# Patient Record
Sex: Female | Born: 1965 | Race: Black or African American | Hispanic: No | Marital: Single | Smoking: Former smoker
Health system: Southern US, Community
[De-identification: ages and names within clinical notes are randomized; demographics above are authoritative.]

## PROBLEM LIST (undated history)

## (undated) DIAGNOSIS — I10 Essential (primary) hypertension: Secondary | ICD-10-CM

## (undated) DIAGNOSIS — E010 Iodine-deficiency related diffuse (endemic) goiter: Secondary | ICD-10-CM

## (undated) DIAGNOSIS — D179 Benign lipomatous neoplasm, unspecified: Secondary | ICD-10-CM

## (undated) DIAGNOSIS — Z8679 Personal history of other diseases of the circulatory system: Secondary | ICD-10-CM

## (undated) DIAGNOSIS — M839 Adult osteomalacia, unspecified: Secondary | ICD-10-CM

## (undated) DIAGNOSIS — M1990 Unspecified osteoarthritis, unspecified site: Secondary | ICD-10-CM

## (undated) DIAGNOSIS — E669 Obesity, unspecified: Secondary | ICD-10-CM

## (undated) HISTORY — DX: Adult osteomalacia, unspecified: M83.9

## (undated) HISTORY — DX: Obesity, unspecified: E66.9

## (undated) HISTORY — DX: Essential (primary) hypertension: I10

## (undated) HISTORY — PX: DILATION AND CURETTAGE OF UTERUS: SHX78

## (undated) HISTORY — DX: Iodine-deficiency related diffuse (endemic) goiter: E01.0

---
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
## 1998-11-28 ENCOUNTER — Encounter: Payer: Self-pay | Admitting: Obstetrics and Gynecology

## 1998-11-28 ENCOUNTER — Inpatient Hospital Stay (HOSPITAL_COMMUNITY): Admission: AD | Admit: 1998-11-28 | Discharge: 1998-12-03 | Payer: Self-pay | Admitting: Obstetrics and Gynecology

## 1998-12-02 ENCOUNTER — Encounter: Payer: Self-pay | Admitting: Obstetrics and Gynecology

## 2000-04-17 ENCOUNTER — Emergency Department (HOSPITAL_COMMUNITY): Admission: EM | Admit: 2000-04-17 | Discharge: 2000-04-17 | Payer: Self-pay | Admitting: Emergency Medicine

## 2000-04-19 ENCOUNTER — Emergency Department (HOSPITAL_COMMUNITY): Admission: EM | Admit: 2000-04-19 | Discharge: 2000-04-19 | Payer: Self-pay | Admitting: Emergency Medicine

## 2000-12-16 ENCOUNTER — Other Ambulatory Visit: Admission: RE | Admit: 2000-12-16 | Discharge: 2000-12-16 | Payer: Self-pay | Admitting: Obstetrics and Gynecology

## 2000-12-21 ENCOUNTER — Ambulatory Visit (HOSPITAL_COMMUNITY): Admission: RE | Admit: 2000-12-21 | Discharge: 2000-12-21 | Payer: Self-pay | Admitting: Obstetrics and Gynecology

## 2000-12-21 ENCOUNTER — Encounter (INDEPENDENT_AMBULATORY_CARE_PROVIDER_SITE_OTHER): Payer: Self-pay

## 2007-08-29 ENCOUNTER — Ambulatory Visit: Payer: Self-pay | Admitting: Family Medicine

## 2007-09-24 ENCOUNTER — Ambulatory Visit: Payer: Self-pay | Admitting: Family Medicine

## 2007-10-07 ENCOUNTER — Ambulatory Visit: Payer: Self-pay | Admitting: Family Medicine

## 2007-10-07 ENCOUNTER — Other Ambulatory Visit: Admission: RE | Admit: 2007-10-07 | Discharge: 2007-10-07 | Payer: Self-pay | Admitting: Family Medicine

## 2007-10-13 LAB — HM PAP SMEAR: HM Pap smear: NEGATIVE

## 2008-11-23 ENCOUNTER — Ambulatory Visit: Payer: Self-pay | Admitting: Family Medicine

## 2009-06-21 ENCOUNTER — Ambulatory Visit: Payer: Self-pay | Admitting: Family Medicine

## 2010-01-11 ENCOUNTER — Ambulatory Visit: Payer: Self-pay | Admitting: Family Medicine

## 2010-06-13 ENCOUNTER — Ambulatory Visit: Payer: Self-pay | Admitting: Family Medicine

## 2010-09-21 LAB — HM MAMMOGRAPHY: HM Mammogram: NEGATIVE

## 2011-03-19 ENCOUNTER — Other Ambulatory Visit: Payer: Self-pay | Admitting: Family Medicine

## 2011-03-23 NOTE — Op Note (Signed)
[facility]  Patient:    Nina Braun, Nina Braun                    MRN: [id number] Proc. Date: 12/21/00 Adm. Date:  [id number] Attending:  Silverio Lay A                           Operative Report  PREOPERATIVE DIAGNOSIS:       Recurrent pregnancy loss with missed abortion.  POSTOPERATIVE DIAGNOSIS:      Recurrent pregnancy loss with missed abortion.  OPERATION:                    D&E.  SURGEON:                      Silverio Lay, M.D.  ANESTHESIA:                   MAC plus paracervical block.  ESTIMATED BLOOD LOSS:         Minimal.  DESCRIPTION OF PROCEDURE:     After reviewing procedure and possible complications with patient including bleeding, infection, uterine perforation, need for laparoscopy, and need for laparotomy, and possibility of retained products of conception needing a second procedure.  Informed consent was obtained.  The patient was taken to OR #3, placed in the lithotomy position, and given IV sedation.  She was prepped and draped in a sterile fashion.  Gyn exam reveals an anteverted uterus with the cervix depth fingertip.  The uterus was compatible with 9-10 weeks pregnancy.  Both adnexa are normal.   A weighted speculum is inserted and the anterior lip of the cervix was grasped with a tenaculum and the proceeded with a paracervical block using _______ 1%, 10 cc in the usual fashion.  The uterus was sounded at 9.5 cm.  Cervical opening allowed a #35 Hegar dilator.  Using a #9 curved cannula, we proceeded with evacuation of uterine contents using suction which does remove products of conception.  After evacuation was completed, a sharp curet was used to remove remainder endometrium until all uterine walls are free of tissue and _________.  Instruments were then removed.  There was no active bleeding.  Estimated blood loss was minimal.  Instruments and sponge count is complete x 2.  The procedure is well-tolerated by patient  who is taken to the recovery room in a well and stable condition. DD:  12/21/00 TD:  12/22/00 Job: [id number]

## 2011-04-17 ENCOUNTER — Other Ambulatory Visit: Payer: Self-pay | Admitting: Family Medicine

## 2011-04-25 ENCOUNTER — Encounter: Payer: Self-pay | Admitting: Family Medicine

## 2011-04-25 DIAGNOSIS — E010 Iodine-deficiency related diffuse (endemic) goiter: Secondary | ICD-10-CM

## 2011-04-25 DIAGNOSIS — E669 Obesity, unspecified: Secondary | ICD-10-CM | POA: Insufficient documentation

## 2011-04-25 DIAGNOSIS — I10 Essential (primary) hypertension: Secondary | ICD-10-CM

## 2011-04-25 DIAGNOSIS — E559 Vitamin D deficiency, unspecified: Secondary | ICD-10-CM | POA: Insufficient documentation

## 2011-05-18 ENCOUNTER — Encounter: Payer: Self-pay | Admitting: Family Medicine

## 2011-05-18 ENCOUNTER — Other Ambulatory Visit: Payer: Self-pay | Admitting: Family Medicine

## 2011-05-22 ENCOUNTER — Ambulatory Visit (INDEPENDENT_AMBULATORY_CARE_PROVIDER_SITE_OTHER): Payer: 59 | Admitting: Family Medicine

## 2011-05-22 ENCOUNTER — Encounter: Payer: Self-pay | Admitting: Family Medicine

## 2011-05-22 VITALS — BP 146/90 | HR 68 | Ht 68.0 in | Wt 229.0 lb

## 2011-05-22 DIAGNOSIS — M839 Adult osteomalacia, unspecified: Secondary | ICD-10-CM

## 2011-05-22 DIAGNOSIS — E669 Obesity, unspecified: Secondary | ICD-10-CM

## 2011-05-22 DIAGNOSIS — I10 Essential (primary) hypertension: Secondary | ICD-10-CM

## 2011-05-22 DIAGNOSIS — Z0000 Encounter for general adult medical examination without abnormal findings: Secondary | ICD-10-CM

## 2011-05-22 LAB — CBC WITH DIFFERENTIAL/PLATELET
Basophils Absolute: 0 10*3/uL (ref 0.0–0.1)
Basophils Relative: 0 % (ref 0–1)
Eosinophils Absolute: 0.1 10*3/uL (ref 0.0–0.7)
Eosinophils Relative: 2 % (ref 0–5)
HCT: 36.2 % (ref 36.0–46.0)
Hemoglobin: 11.9 g/dL — ABNORMAL LOW (ref 12.0–15.0)
Lymphocytes Relative: 45 % (ref 12–46)
Lymphs Abs: 2.3 10*3/uL (ref 0.7–4.0)
MCH: 30.4 pg (ref 26.0–34.0)
MCHC: 32.9 g/dL (ref 30.0–36.0)
MCV: 92.6 fL (ref 78.0–100.0)
Monocytes Absolute: 0.3 10*3/uL (ref 0.1–1.0)
Monocytes Relative: 5 % (ref 3–12)
Neutro Abs: 2.4 10*3/uL (ref 1.7–7.7)
Neutrophils Relative %: 47 % (ref 43–77)
Platelets: 260 10*3/uL (ref 150–400)
RBC: 3.91 MIL/uL (ref 3.87–5.11)
RDW: 13.5 % (ref 11.5–15.5)
WBC: 5.1 10*3/uL (ref 4.0–10.5)

## 2011-05-22 LAB — COMPREHENSIVE METABOLIC PANEL
Albumin: 4.2 g/dL (ref 3.5–5.2)
CO2: 23 mEq/L (ref 19–32)
Calcium: 9.2 mg/dL (ref 8.4–10.5)
Chloride: 101 mEq/L (ref 96–112)
Glucose, Bld: 88 mg/dL (ref 70–99)
Potassium: 4 mEq/L (ref 3.5–5.3)
Sodium: 135 mEq/L (ref 135–145)
Total Protein: 7.2 g/dL (ref 6.0–8.3)

## 2011-05-22 LAB — LIPID PANEL
Cholesterol: 189 mg/dL (ref 0–200)
Triglycerides: 84 mg/dL (ref <150)

## 2011-05-22 NOTE — Progress Notes (Signed)
  Subjective:    Patient ID: Nina Braun, female    DOB: [birth year], 45 y.o.   MRN: [id number]  HPI She is here for a complete examination. She is now involved in Weight Watchers and so far has lost 15 pounds. She does have a goal weight in mind. She has no particular concerns or complaints. Past medical history is significant for hypertension, thyromegaly and vitamin D deficiency. She does continue on a multivitamin.   Review of Systems  Constitutional: Negative.   HENT: Negative.   Eyes: Negative.   Respiratory: Negative.   Cardiovascular: Negative.   Gastrointestinal: Negative.   Genitourinary: Negative.   Musculoskeletal: Negative.   Neurological: Negative.   Hematological: Negative.   Psychiatric/Behavioral: Negative.        Objective:   Physical Exam BP 146/90  Pulse 68  Ht 5\' 8"  (1.727 m)  Wt 229 lb (103.874 kg)  BMI 34.82 kg/m2  LMP 05/14/2011  General Appearance:    Alert, cooperative, no distress, appears stated age  Head:    Normocephalic, without obvious abnormality, atraumatic  Eyes:    PERRL, conjunctiva/corneas clear, EOM's intact, fundi    benign  Ears:    Normal TM's and external ear canals  Nose:   Nares normal, mucosa normal, no drainage or sinus   tenderness  Throat:   Lips, mucosa, and tongue normal; teeth and gums normal  Neck:   Supple, no lymphadenopathy;  thyroid:  no   enlargement/tenderness/nodules; no carotid   bruit or JVD  Back:    Spine nontender, no curvature, ROM normal, no CVA     tenderness  Lungs:     Clear to auscultation bilaterally without wheezes, rales or     ronchi; respirations unlabored  Chest Wall:    No tenderness or deformity   Heart:    Regular rate and rhythm, S1 and S2 normal, no murmur, rub   or gallop  Breast Exam:    Deferred to GYN  Abdomen:     Soft, non-tender, nondistended, normoactive bowel sounds,    no masses, no hepatosplenomegaly  Genitalia:    Deferred to GYN     Extremities:   No clubbing, cyanosis  or edema  Pulses:   2+ and symmetric all extremities  Skin:   Skin color, texture, turgor normal, no rashes or lesions  Lymph nodes:   Cervical, supraclavicular, and axillary nodes normal  Neurologic:   CNII-XII intact, normal strength, sensation and gait; reflexes 2+ and symmetric throughout          Psych:   Normal mood, affect, hygiene and grooming.           Assessment & Plan:  Hypertension. Vitamin D deficiency. Obesity. Encouraged her to continue with the Weight Watchers program. Discussed setting a goal dressed or pants size as opposed to weight. Continue on present medications. Routine blood screening including vitamin D. will be ordered.

## 2011-05-23 ENCOUNTER — Telehealth: Payer: Self-pay

## 2011-05-23 LAB — VITAMIN D 25 HYDROXY (VIT D DEFICIENCY, FRACTURES): Vit D, 25-Hydroxy: 25 ng/mL — ABNORMAL LOW (ref 30–89)

## 2011-05-23 NOTE — Telephone Encounter (Signed)
Pt informed

## 2011-06-17 ENCOUNTER — Other Ambulatory Visit: Payer: Self-pay | Admitting: Family Medicine

## 2011-07-19 ENCOUNTER — Other Ambulatory Visit: Payer: Self-pay | Admitting: Family Medicine

## 2011-09-14 ENCOUNTER — Other Ambulatory Visit: Payer: Self-pay | Admitting: Family Medicine

## 2011-11-17 ENCOUNTER — Other Ambulatory Visit: Payer: Self-pay | Admitting: Family Medicine

## 2012-01-15 ENCOUNTER — Other Ambulatory Visit: Payer: Self-pay | Admitting: Family Medicine

## 2012-02-21 ENCOUNTER — Encounter: Payer: Self-pay | Admitting: Internal Medicine

## 2012-03-14 ENCOUNTER — Other Ambulatory Visit: Payer: Self-pay | Admitting: Family Medicine

## 2012-04-22 ENCOUNTER — Encounter: Payer: Self-pay | Admitting: Family Medicine

## 2012-04-22 ENCOUNTER — Ambulatory Visit (INDEPENDENT_AMBULATORY_CARE_PROVIDER_SITE_OTHER): Payer: 59 | Admitting: Family Medicine

## 2012-04-22 VITALS — BP 142/90 | HR 62 | Wt 211.0 lb

## 2012-04-22 DIAGNOSIS — E049 Nontoxic goiter, unspecified: Secondary | ICD-10-CM

## 2012-04-22 DIAGNOSIS — Z79899 Other long term (current) drug therapy: Secondary | ICD-10-CM

## 2012-04-22 DIAGNOSIS — E669 Obesity, unspecified: Secondary | ICD-10-CM

## 2012-04-22 DIAGNOSIS — I10 Essential (primary) hypertension: Secondary | ICD-10-CM

## 2012-04-22 DIAGNOSIS — E010 Iodine-deficiency related diffuse (endemic) goiter: Secondary | ICD-10-CM

## 2012-04-22 DIAGNOSIS — E559 Vitamin D deficiency, unspecified: Secondary | ICD-10-CM

## 2012-04-22 MED ORDER — LISINOPRIL-HYDROCHLOROTHIAZIDE 20-12.5 MG PO TABS: 1.0000 | ORAL_TABLET | Freq: Every day | ORAL | Status: DC

## 2012-04-22 NOTE — Patient Instructions (Signed)
Keep taking good care of yourself and stick with the Weight Watchers program

## 2012-04-22 NOTE — Progress Notes (Signed)
  Subjective:    Patient ID: Nina Braun, female    DOB: Jun 20, 1966, 46 y.o.   MRN: [id number]  HPI She is here for a routine checkup. She continues on medications listed in the chart. She has been involved in a Weight Watchers program and is happy with the response that she has made. He has no other concerns or complaints. Her family and social history were reviewed.   Review of Systems     Objective:   Physical Exam alert and in no distress. Tympanic membranes and canals are normal. Throat is clear. Tonsils are normal. Neck is supple without adenopathy or thyromegaly. Cardiac exam shows a regular sinus rhythm without murmurs or gallops. Lungs are clear to auscultation.        Assessment & Plan:   1. Obesity  CBC with Differential, Comprehensive metabolic panel  2. HTN (hypertension)    3. Thyromegaly    4. Vitamin d deficiency  Vitamin D 25 hydroxy  5. Encounter for long-term (current) use of other medications  CBC with Differential, Comprehensive metabolic panel   encouraged her to continue with her weight loss. I will increase her lisinopril. Recheck here as needed.

## 2012-04-23 LAB — CBC WITH DIFFERENTIAL/PLATELET
Eosinophils Absolute: 0.1 10*3/uL (ref 0.0–0.7)
Eosinophils Relative: 3 % (ref 0–5)
HCT: 36.9 % (ref 36.0–46.0)
Hemoglobin: 11.8 g/dL — ABNORMAL LOW (ref 12.0–15.0)
Lymphocytes Relative: 44 % (ref 12–46)
Lymphs Abs: 2.3 10*3/uL (ref 0.7–4.0)
MCH: 29.6 pg (ref 26.0–34.0)
MCV: 92.5 fL (ref 78.0–100.0)
Monocytes Absolute: 0.4 10*3/uL (ref 0.1–1.0)
Monocytes Relative: 7 % (ref 3–12)
RBC: 3.99 MIL/uL (ref 3.87–5.11)
WBC: 5.2 10*3/uL (ref 4.0–10.5)

## 2012-04-23 LAB — COMPREHENSIVE METABOLIC PANEL
ALT: 14 U/L (ref 0–35)
CO2: 25 mEq/L (ref 19–32)
Calcium: 9.2 mg/dL (ref 8.4–10.5)
Chloride: 104 mEq/L (ref 96–112)
Creat: 0.83 mg/dL (ref 0.50–1.10)
Glucose, Bld: 83 mg/dL (ref 70–99)

## 2012-05-13 ENCOUNTER — Other Ambulatory Visit: Payer: Self-pay | Admitting: Family Medicine

## 2012-06-18 ENCOUNTER — Encounter: Payer: Self-pay | Admitting: Family Medicine

## 2012-06-18 ENCOUNTER — Ambulatory Visit (INDEPENDENT_AMBULATORY_CARE_PROVIDER_SITE_OTHER): Payer: 59 | Admitting: Family Medicine

## 2012-06-18 VITALS — BP 126/80 | HR 100 | Wt 217.0 lb

## 2012-06-18 DIAGNOSIS — S00531A Contusion of lip, initial encounter: Secondary | ICD-10-CM

## 2012-06-18 DIAGNOSIS — S0003XA Contusion of scalp, initial encounter: Secondary | ICD-10-CM

## 2012-06-18 DIAGNOSIS — S1093XA Contusion of unspecified part of neck, initial encounter: Secondary | ICD-10-CM

## 2012-06-18 NOTE — Progress Notes (Signed)
  Subjective:    Patient ID: Nina Braun, female    DOB: 03-16-1966, 46 y.o.   MRN: [id number]  HPI She sustained an injury to her lip earlier today when a box she was carrying was accidentally bumped causing her to hit her upper lip. She is not complaining of any tooth discomfort.   Review of Systems     Objective:   Physical Exam The midportion of her upper lip is swollen. Teeth are tender or loose.not       Assessment & Plan:  Lip contusion.  Supportive care using ice. She asked for a note for work I told her that I could not medically excuse her from work based on this.

## 2013-04-15 ENCOUNTER — Telehealth: Payer: Self-pay | Admitting: Family Medicine

## 2013-04-15 ENCOUNTER — Ambulatory Visit: Payer: Self-pay | Admitting: Family Medicine

## 2013-04-15 MED ORDER — LISINOPRIL-HYDROCHLOROTHIAZIDE 20-12.5 MG PO TABS: 1.0000 | ORAL_TABLET | Freq: Every day | ORAL | Status: DC

## 2013-04-15 NOTE — Telephone Encounter (Signed)
SENT IN B/P MED  

## 2013-04-20 ENCOUNTER — Other Ambulatory Visit: Payer: Self-pay | Admitting: Family Medicine

## 2013-06-16 ENCOUNTER — Ambulatory Visit (INDEPENDENT_AMBULATORY_CARE_PROVIDER_SITE_OTHER): Payer: 59 | Admitting: Family Medicine

## 2013-06-16 ENCOUNTER — Other Ambulatory Visit (HOSPITAL_COMMUNITY)
Admission: RE | Admit: 2013-06-16 | Discharge: 2013-06-16 | Disposition: A | Discharge status: Home / Self Care | Payer: 59 | Source: Ambulatory Visit | Attending: Family Medicine | Admitting: Family Medicine

## 2013-06-16 ENCOUNTER — Encounter: Payer: Self-pay | Admitting: Family Medicine

## 2013-06-16 VITALS — BP 130/84 | HR 82 | Ht 68.0 in | Wt 226.0 lb

## 2013-06-16 DIAGNOSIS — Z01419 Encounter for gynecological examination (general) (routine) without abnormal findings: Secondary | ICD-10-CM | POA: Insufficient documentation

## 2013-06-16 DIAGNOSIS — I10 Essential (primary) hypertension: Secondary | ICD-10-CM

## 2013-06-16 DIAGNOSIS — E669 Obesity, unspecified: Secondary | ICD-10-CM

## 2013-06-16 DIAGNOSIS — Z0000 Encounter for general adult medical examination without abnormal findings: Secondary | ICD-10-CM

## 2013-06-16 DIAGNOSIS — E559 Vitamin D deficiency, unspecified: Secondary | ICD-10-CM

## 2013-06-16 LAB — POCT URINALYSIS DIPSTICK
Bilirubin, UA: NEGATIVE
Ketones, UA: NEGATIVE
Leukocytes, UA: NEGATIVE
Nitrite, UA: NEGATIVE
Protein, UA: NEGATIVE
pH, UA: 7

## 2013-06-16 LAB — HM PAP SMEAR: HM Pap smear: NEGATIVE

## 2013-06-16 LAB — HEMOCCULT GUIAC POC 1CARD (OFFICE): Fecal Occult Blood, POC: NEGATIVE

## 2013-06-16 NOTE — Progress Notes (Signed)
Subjective:    Patient ID: Nina Braun, female    DOB: 18-Aug-1966, 47 y.o.   MRN: [id number]  HPI She is here for a complete examination. She does have hypertension he continues on medication. Review of record indicates that she did lose some weight but gained it back. She was involved in Weight Watchers but subsequently has stopped. She admits to stress eating. She describes stresses in her life of work as well as her mother's health. Her exercise is quite minimal. Work also has as her stress. She has had some foam difficulty and is considering filing a Teacher, adult education. claim. Her family and social history were reviewed. She has no other concerns or complaints. Review of record indicates she does have a previous history of vitamin D deficiency.   Review of Systems  Constitutional: Negative.   HENT: Negative.   Eyes: Negative.   Respiratory: Negative.   Cardiovascular: Negative.   Gastrointestinal: Negative.   Endocrine: Negative.   Genitourinary: Negative.   Allergic/Immunologic: Negative.   Neurological: Negative.   Hematological: Negative.   Psychiatric/Behavioral: Negative.        Objective:   Physical Exam BP 130/84  Pulse 82  Ht 5\' 8"  (1.727 m)  Wt 226 lb (102.513 kg)  BMI 34.37 kg/m2  LMP 05/22/2013  General Appearance:    Alert, cooperative, no distress, appears stated age  Head:    Normocephalic, without obvious abnormality, atraumatic  Eyes:    PERRL, conjunctiva/corneas clear, EOM's intact, fundi    benign  Ears:    Normal TM's and external ear canals  Nose:   Nares normal, mucosa normal, no drainage or sinus   tenderness  Throat:   Lips, mucosa, and tongue normal; teeth and gums normal  Neck:   Supple, no lymphadenopathy;  thyroid:  no   enlargement/tenderness/nodules; no carotid   bruit or JVD  Back:    Spine nontender, no curvature, ROM normal, no CVA     tenderness  Lungs:     Clear to auscultation bilaterally without wheezes, rales or     ronchi;  respirations unlabored  Chest Wall:    No tenderness or deformity   Heart:    Regular rate and rhythm, S1 and S2 normal, no murmur, rub   or gallop     Abdomen:     Soft, non-tender, nondistended, normoactive bowel sounds,    no masses, no hepatosplenomegaly  Genitalia:    Normal external genitalia without lesions.  BUS and vagina normal; cervix without lesions, or cervical motion tenderness. No abnormal vaginal discharge.  Uterus and adnexa not enlarged, nontender, no masses.  Pap performed  Rectal:    Normal tone, no masses or tenderness; guaiac negative stool  Extremities:   No clubbing, cyanosis or edema  Pulses:   2+ and symmetric all extremities  Skin:   Skin color, texture, turgor normal, no rashes or lesions  Lymph nodes:   Cervical, supraclavicular, and axillary nodes normal  Neurologic:   CNII-XII intact, normal strength, sensation and gait; reflexes 2+ and symmetric throughout          Psych:   Normal mood, affect, hygiene and grooming.         Assessment & Plan:  Routine general medical examination at a health care facility - Plan: CBC with Differential, Comprehensive metabolic panel, Lipid panel, Vitamin D 25 hydroxy, POCT Urinalysis Dipstick, Cytology - PAP [place], Hemoccult - 1 Card (office)  HTN (hypertension) - Plan: CBC with Differential, Comprehensive metabolic panel,  Lipid panel  Obesity - Plan: CBC with Differential, Comprehensive metabolic panel, Lipid panel  Vitamin D deficiency - Plan: Vitamin D 25 hydroxy  had a long discussion with her concerning diet and exercise as well as stress and stress management. Strongly encouraged her to get involved in Weight Watchers again as well as potential he getting help with stress and stress management.

## 2013-06-17 LAB — CBC WITH DIFFERENTIAL/PLATELET
Basophils Absolute: 0 10*3/uL (ref 0.0–0.1)
HCT: 36.1 % (ref 36.0–46.0)
Lymphocytes Relative: 42 % (ref 12–46)
Lymphs Abs: 2.5 10*3/uL (ref 0.7–4.0)
Monocytes Absolute: 0.4 10*3/uL (ref 0.1–1.0)
Neutro Abs: 2.9 10*3/uL (ref 1.7–7.7)
Platelets: 246 10*3/uL (ref 150–400)
RBC: 3.94 MIL/uL (ref 3.87–5.11)
RDW: 14.1 % (ref 11.5–15.5)
WBC: 5.9 10*3/uL (ref 4.0–10.5)

## 2013-06-17 LAB — COMPREHENSIVE METABOLIC PANEL
ALT: 13 U/L (ref 0–35)
AST: 18 U/L (ref 0–37)
Albumin: 3.9 g/dL (ref 3.5–5.2)
Calcium: 8.9 mg/dL (ref 8.4–10.5)
Chloride: 102 mEq/L (ref 96–112)
Potassium: 4 mEq/L (ref 3.5–5.3)

## 2013-06-17 LAB — VITAMIN D 25 HYDROXY (VIT D DEFICIENCY, FRACTURES): Vit D, 25-Hydroxy: 27 ng/mL — ABNORMAL LOW (ref 30–89)

## 2013-06-17 LAB — LIPID PANEL
Cholesterol: 199 mg/dL (ref 0–200)
Total CHOL/HDL Ratio: 3 Ratio
VLDL: 13 mg/dL (ref 0–40)

## 2013-06-18 ENCOUNTER — Encounter: Payer: Self-pay | Admitting: Internal Medicine

## 2013-09-10 ENCOUNTER — Other Ambulatory Visit: Payer: Self-pay

## 2013-12-05 ENCOUNTER — Emergency Department (INDEPENDENT_AMBULATORY_CARE_PROVIDER_SITE_OTHER)
Admission: EM | Admit: 2013-12-05 | Discharge: 2013-12-05 | Disposition: A | Discharge status: Home / Self Care | Payer: 59 | Source: Home / Self Care | Attending: Family Medicine | Admitting: Family Medicine

## 2013-12-05 ENCOUNTER — Encounter (HOSPITAL_COMMUNITY): Payer: Self-pay | Admitting: Emergency Medicine

## 2013-12-05 DIAGNOSIS — I10 Essential (primary) hypertension: Secondary | ICD-10-CM

## 2013-12-05 DIAGNOSIS — J069 Acute upper respiratory infection, unspecified: Secondary | ICD-10-CM

## 2013-12-05 MED ORDER — HYDROCOD POLST-CHLORPHEN POLST 10-8 MG/5ML PO LQCR: 5.0000 mL | Freq: Two times a day (BID) | ORAL | Status: DC | PRN

## 2013-12-05 NOTE — Discharge Instructions (Signed)
Rest and drink lots of fluids over the next 2 days. Take medication as directed for cough as needed. Ibuprofen for aches and pain. Monitor blood pressure as discussed and write down some to share with MD. Arrange follow up with Dr Redmond School or new PCP as soon as symptoms resolve to have your blood pressure re-evaluated.   Upper Respiratory Infection, Adult An upper respiratory infection (URI) is also known as the common cold. It is often caused by a type of germ (virus). Colds are easily spread (contagious). You can pass it to others by kissing, coughing, sneezing, or drinking out of the same glass. Usually, you get better in 1 or 2 weeks.  HOME CARE   Only take medicine as told by your doctor.  Use a warm mist humidifier or breathe in steam from a hot shower.  Drink enough water and fluids to keep your pee (urine) clear or pale yellow.  Get plenty of rest.  Return to work when your temperature is back to normal or as told by your doctor. You may use a face mask and wash your hands to stop your cold from spreading. GET HELP RIGHT AWAY IF:   After the first few days, you feel you are getting worse.  You have questions about your medicine.  You have chills, shortness of breath, or brown or red spit (mucus).  You have yellow or brown snot (nasal discharge) or pain in the face, especially when you bend forward.  You have a fever, puffy (swollen) neck, pain when you swallow, or white spots in the back of your throat.  You have a bad headache, ear pain, sinus pain, or chest pain.  You have a high-pitched whistling sound when you breathe in and out (wheezing).  You have a lasting cough or cough up blood.  You have sore muscles or a stiff neck. MAKE SURE YOU:   Understand these instructions.  Will watch your condition.  Will get help right away if you are not doing well or get worse. Document Released: 04/09/2008 Document Revised: 01/14/2012 Document Reviewed: 02/26/2011 [facility]  Patient Information 2014 [employer], [place].  Hypertension Hypertension is another name for high blood pressure. High blood pressure may mean that your heart needs to work harder to pump blood. Blood pressure consists of two numbers, which includes a higher number over a lower number (example: 110/72). HOME CARE   Make lifestyle changes as told by your doctor. This may include weight loss and exercise.  Take your blood pressure medicine every day.  Limit how much salt you use.  Stop smoking if you smoke.  Do not use drugs.  Talk to your doctor if you are using decongestants or birth control pills. These medicines might make blood pressure higher.  Females should not drink more than 1 alcoholic drink per day. Males should not drink more than 2 alcoholic drinks per day.  See your doctor as told. GET HELP RIGHT AWAY IF:   You have a blood pressure reading with a top number of 180 or higher.  You get a very bad headache.  You get blurred or changing vision.  You feel confused.  You feel weak, numb, or faint.  You get chest or belly (abdominal) pain.  You throw up (vomit).  You cannot breathe very well. MAKE SURE YOU:   Understand these instructions.  Will watch your condition.  Will get help right away if you are not doing well or get worse. Document Released: 04/09/2008 Document Revised: 01/14/2012  Document Reviewed: 04/09/2008 [facility] Patient Information 2014 [place].

## 2013-12-05 NOTE — ED Provider Notes (Signed)
CSN: [id number]     Arrival date & time 12/05/13  1034 History   First MD Initiated Contact with Patient 12/05/13 1132     Chief Complaint  Patient presents with  . URI    Patient is a 48 y.o. female presenting with URI. The history is provided by the patient.  URI Presenting symptoms: congestion, cough, fatigue, fever, rhinorrhea and sore throat   Presenting symptoms: no ear pain   Severity:  Mild Onset quality:  Gradual Duration:  7 days Timing:  Constant Progression:  Improving Chronicity:  New Relieved by:  OTC medications Associated symptoms: headaches   Associated symptoms: no wheezing   Risk factors: not elderly and no chronic respiratory disease   Pt reports URI type symptoms since Sunday. When sx's started she had sore throat, fever, runny nose and sore throat. Onset of cough Monday that was persistent but is now improving. No fever since Wednesday.  Mild cough, intermittent h/a, general malaise and intermittent dizziness have persisted. Pt thinks she may be somewhat dehydrated. Pt has been out of work 2 days this week d/t sx's and today attempted to go back to work but was too weak and still did not feel well enough to work. Denies CP, SOB, N/V/D or other sx's. Though she admits all of her symptoms have improved and are minimal now she needs a note stating she can return to work.   Past Medical History  Diagnosis Date  . Hypertension   . Obesity   . Thyromegaly   . Vitamin D deficient osteomalacia    History reviewed. No pertinent past surgical history. Family History  Problem Relation Age of Onset  . Hypertension Mother   . Heart disease Mother   . Diabetes Mother   . Hyperlipidemia Mother   . Heart disease Father   . Hypertension Father   . Stroke Father   . Hyperlipidemia Father   . Allergies Sister   . Hypertension Sister   . Diabetes Brother   . Cancer Paternal Grandfather   . Diabetes Daughter    History  Substance Use Topics  . Smoking status: Former  Smoker    Quit date: 07/17/2007  . Smokeless tobacco: Not on file  . Alcohol Use: 1.8 oz/week    3 Shots of liquor per week   OB History   Grav Para Term Preterm Abortions TAB SAB Ect Mult Living                 Review of Systems  Constitutional: Positive for fever, chills and fatigue.  HENT: Positive for congestion, rhinorrhea and sore throat. Negative for ear pain, sinus pressure, trouble swallowing and voice change.   Eyes: Negative.   Respiratory: Positive for cough. Negative for wheezing.   Cardiovascular: Negative.   Gastrointestinal: Negative.   Endocrine: Negative.   Genitourinary: Negative.   Musculoskeletal: Negative.   Skin: Negative.   Allergic/Immunologic: Negative.   Neurological: Positive for headaches.  Hematological: Negative.   Psychiatric/Behavioral: Negative.     Allergies  Review of patient's allergies indicates no known allergies.  Home Medications   Current Outpatient Rx  Name  Route  Sig  Dispense  Refill  . acetaminophen (TYLENOL) 325 MG tablet   Oral   Take 325 mg by mouth every 6 (six) hours as needed.           . chlorpheniramine-HYDROcodone (TUSSIONEX PENNKINETIC ER) 10-8 MG/5ML LQCR   Oral   Take 5 mLs by mouth every 12 (twelve) hours as  needed for cough.   50 mL   0   . lisinopril-hydrochlorothiazide (ZESTORETIC) 20-12.5 MG per tablet   Oral   Take 1 tablet by mouth daily.   90 tablet   0   . Multiple Vitamins-Minerals (HAIR VITAMINS PO)   Oral   Take by mouth.         . vitamin E 100 UNIT capsule   Oral   Take 100 Units by mouth daily.         . Zinc Sulfate (ZINC 15 PO)   Oral   Take by mouth.          BP 134/85  Pulse 67  Temp(Src) 98.7 F (37.1 C) (Oral)  Resp 17  SpO2 100%  LMP 11/15/2013 Physical Exam  Constitutional: She is oriented to person, place, and time. She appears well-developed and well-nourished.  HENT:  Head: Normocephalic and atraumatic.  Right Ear: Tympanic membrane, external ear and  ear canal normal.  Left Ear: Tympanic membrane, external ear and ear canal normal.  Nose: Nose normal. Right sinus exhibits no maxillary sinus tenderness and no frontal sinus tenderness. Left sinus exhibits no maxillary sinus tenderness and no frontal sinus tenderness.  Mouth/Throat: Uvula is midline, oropharynx is clear and moist and mucous membranes are normal.  Eyes: Conjunctivae are normal.  Neck: Neck supple.  Cardiovascular: Normal rate and regular rhythm.   Pulmonary/Chest: Effort normal and breath sounds normal.  Occasional dry cough noted  Abdominal: Bowel sounds are normal.  Musculoskeletal: Normal range of motion.  Neurological: She is alert and oriented to person, place, and time.  Skin: Skin is warm and dry.  Psychiatric: She has a normal mood and affect.    ED Course  Procedures (including critical care time) Labs Review Labs Reviewed - No data to display Imaging Review No results found.    MDM   1. URI (upper respiratory infection)   2. Hypertension     Resolving URI w/ residual malaise, mild cough, h/a and intermittent dizziness. Will encourage rest and po fluids x 2 days w/ short course of medication for cough. Pt to arrange f/u w/ PCP if dizziness persist after URI completely resolves. BP initially elevated but improved at time of d/c. Pt encouraged to monitor BP at home and document trends to share w/ PCP. Pt agreeable w/ plan.   Jeryl Columbia, NP 12/05/13 1226  Medical screening examination/treatment/procedure(s) were performed by a resident physician or non-physician practitioner and as the supervising physician I was immediately available for consultation/collaboration.  Lynne Leader, MD    Gregor Hams, MD 12/07/13 [phone]

## 2013-12-05 NOTE — ED Notes (Signed)
Pt    Reports         Symptoms  Of  Body       Aches   sorethroat           Chills   Productive  Cough  /  Mucous  Production           Pt  Has  Some  Nausea  But  No  Diarrhea     Or  Vomiting

## 2014-02-25 LAB — HM MAMMOGRAPHY

## 2014-02-26 ENCOUNTER — Encounter: Payer: Self-pay | Admitting: Internal Medicine

## 2014-07-26 ENCOUNTER — Telehealth: Payer: Self-pay | Admitting: Family Medicine

## 2014-07-26 ENCOUNTER — Other Ambulatory Visit: Payer: Self-pay | Admitting: Family Medicine

## 2014-07-26 MED ORDER — LISINOPRIL-HYDROCHLOROTHIAZIDE 20-12.5 MG PO TABS: 1.0000 | ORAL_TABLET | Freq: Every day | ORAL | Status: AC

## 2014-07-26 NOTE — Telephone Encounter (Signed)
Pt called to schedule cpe on 09/06/14 and needs Lisinopril refilled to then

## 2014-08-05 ENCOUNTER — Ambulatory Visit (INDEPENDENT_AMBULATORY_CARE_PROVIDER_SITE_OTHER): Payer: 59

## 2014-08-05 ENCOUNTER — Encounter: Payer: Self-pay | Admitting: Podiatry

## 2014-08-05 ENCOUNTER — Ambulatory Visit (INDEPENDENT_AMBULATORY_CARE_PROVIDER_SITE_OTHER): Payer: 59 | Admitting: Podiatry

## 2014-08-05 VITALS — BP 122/78 | HR 83 | Resp 16

## 2014-08-05 DIAGNOSIS — M722 Plantar fascial fibromatosis: Secondary | ICD-10-CM

## 2014-08-05 MED ORDER — TRIAMCINOLONE ACETONIDE 10 MG/ML IJ SUSP
10.0000 mg | Freq: Once | INTRAMUSCULAR | Status: AC
  Administered 2014-08-05: 10 mg

## 2014-08-05 NOTE — Patient Instructions (Signed)

## 2014-08-05 NOTE — Progress Notes (Addendum)
   Subjective:    Patient ID: Nina Braun, female    DOB: 09/07/1966, 48 y.o.   MRN: [id number]  HPI Comments: "I have heel pain"  Patient c/o aching plantar heel left for about 3 month. She does have AM pain. Walking a lot makes worse. No home treatment.  Foot Pain      Review of Systems  All other systems reviewed and are negative.      Objective:   Physical Exam  Nursing note and vitals reviewed. Constitutional: She is oriented to person, place, and time.  Cardiovascular: Intact distal pulses.   Musculoskeletal: Normal range of motion.  Neurological: She is oriented to person, place, and time.  Skin: Skin is warm.   neurovascular status found to be intact with muscle strength adequate and range of motion of subtalar and midtarsal joint within normal limits. Patient is noted to have exquisite discomfort left heel at the insertion of the tendon into the calcaneus with mild equinus condition and good digital perfusion. Arch height is found to be moderately depressed upon weightbearing        Assessment & Plan:  Plan her fasciitis of the left heel with inflammation and fluid noted. H&P and x-ray reviewed and today I injected the left plantar fashion 3 mg Kenalog 5 mg Xylocaine Marcaine mixture and applied fascially brace with instructions. Laced on diclofenac 75 mg twice a day and reappoint in 1 week

## 2014-08-12 ENCOUNTER — Encounter: Payer: Self-pay | Admitting: Podiatry

## 2014-08-12 ENCOUNTER — Ambulatory Visit (INDEPENDENT_AMBULATORY_CARE_PROVIDER_SITE_OTHER): Payer: 59 | Admitting: Podiatry

## 2014-08-12 VITALS — BP 112/78 | HR 65 | Resp 17

## 2014-08-12 DIAGNOSIS — M722 Plantar fascial fibromatosis: Secondary | ICD-10-CM

## 2014-08-13 NOTE — Progress Notes (Signed)
Subjective:     Patient ID: Nina Braun, female   DOB: 1966-10-07, 48 y.o.   MRN: [id number]  HPI patient states that my heel is feeling quite a bit better and I been walking with minimal discomfort   Review of Systems     Objective:   Physical Exam Neurovascular status intact with significant reduction discomfort to the medial band at the insertion of the tendon into the calcaneus    Assessment:     Improved plantar fasciitis    Plan:     Reviewed condition and discussed physical therapy supportive shoes and not going barefoot. Patient will be seen back if symptoms were to recur

## 2014-09-06 ENCOUNTER — Encounter: Payer: 59 | Admitting: Family Medicine

## 2014-10-19 ENCOUNTER — Other Ambulatory Visit: Payer: Self-pay | Admitting: Family Medicine

## 2014-10-19 DIAGNOSIS — E010 Iodine-deficiency related diffuse (endemic) goiter: Secondary | ICD-10-CM

## 2014-10-24 ENCOUNTER — Other Ambulatory Visit: Payer: Self-pay | Admitting: Family Medicine

## 2014-10-26 ENCOUNTER — Ambulatory Visit
Admission: RE | Admit: 2014-10-26 | Discharge: 2014-10-26 | Disposition: A | Discharge status: Home / Self Care | Payer: 59 | Source: Ambulatory Visit | Attending: Family Medicine | Admitting: Family Medicine

## 2014-10-26 DIAGNOSIS — E010 Iodine-deficiency related diffuse (endemic) goiter: Secondary | ICD-10-CM

## 2015-06-15 ENCOUNTER — Other Ambulatory Visit: Payer: Self-pay | Admitting: Gastroenterology

## 2015-06-24 IMAGING — US US SOFT TISSUE HEAD/NECK
1 series · 14 of 25 positions shown · non-contrast
Comparison: None.

CLINICAL DATA: 48-year-old female with a history of thyromegaly.

EXAM:
THYROID ULTRASOUND
TECHNIQUE: Ultrasound examination of the thyroid gland and adjacent soft
tissues was performed.

[Series 1: us soft tissue head/neck · 0.08mm/px · 14 of 29 slices shown]
[im 1/29]
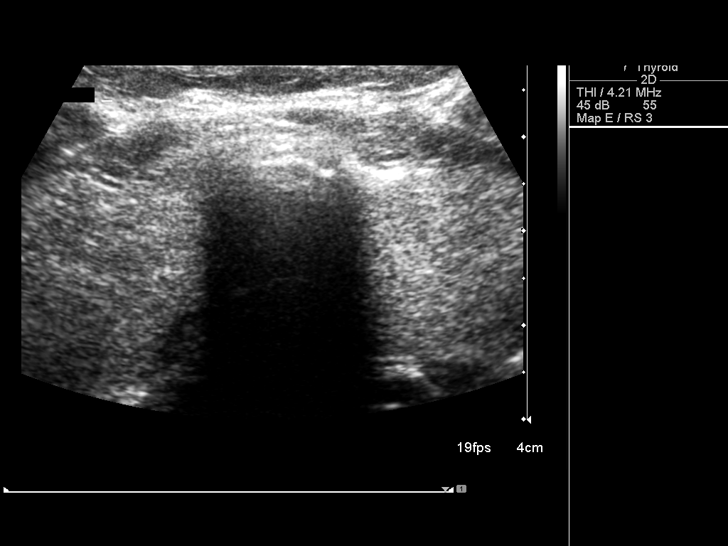
[im 3/29]
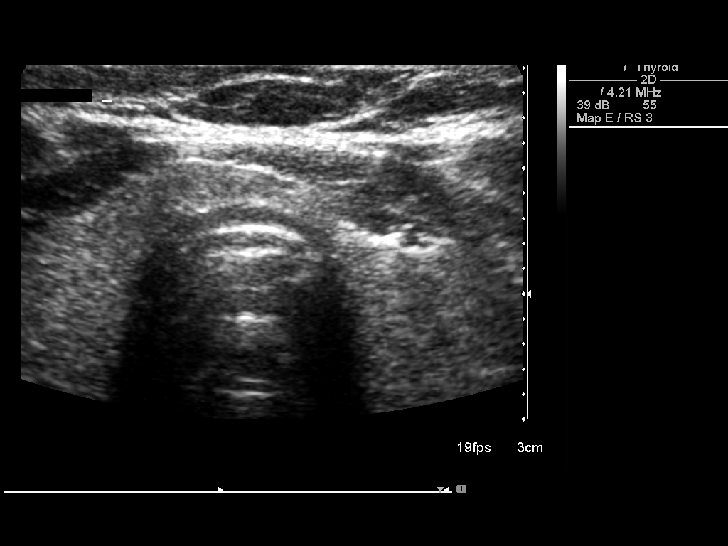
[im 5/29]
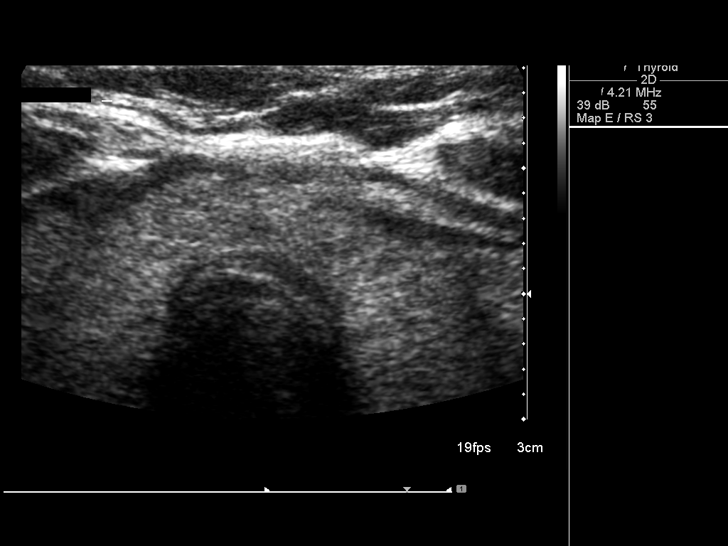
[im 8/29]
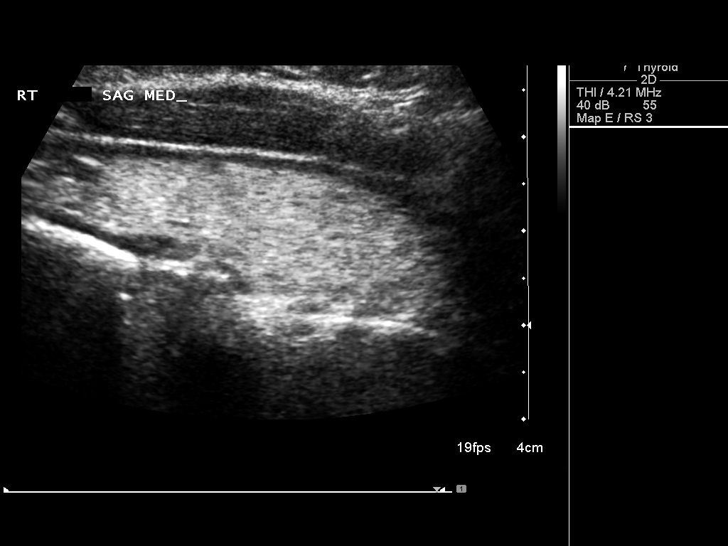
[im 10/29]
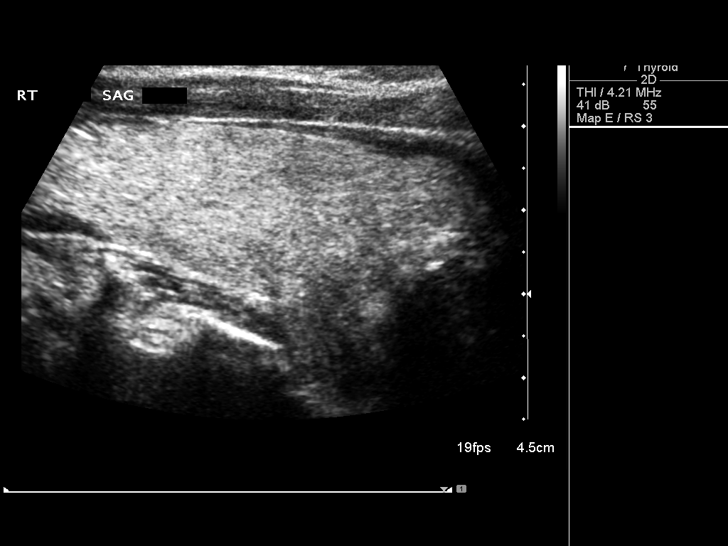
[im 11/29]
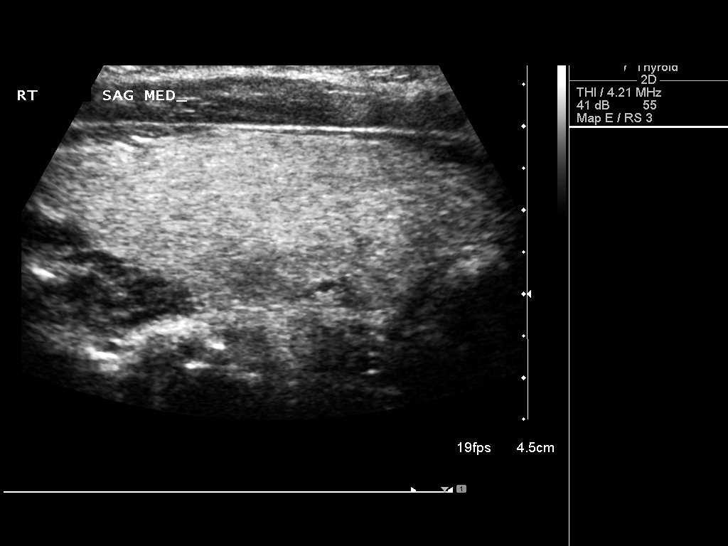
[im 13/29]
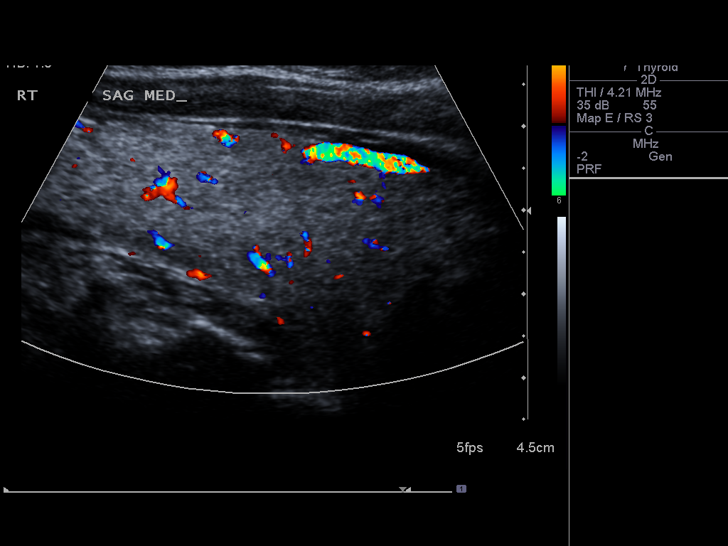
[im 16/29]
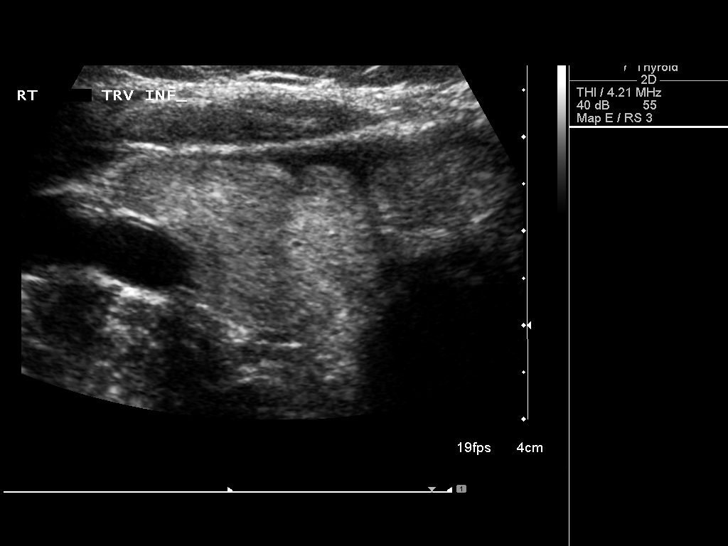
[im 18/29]
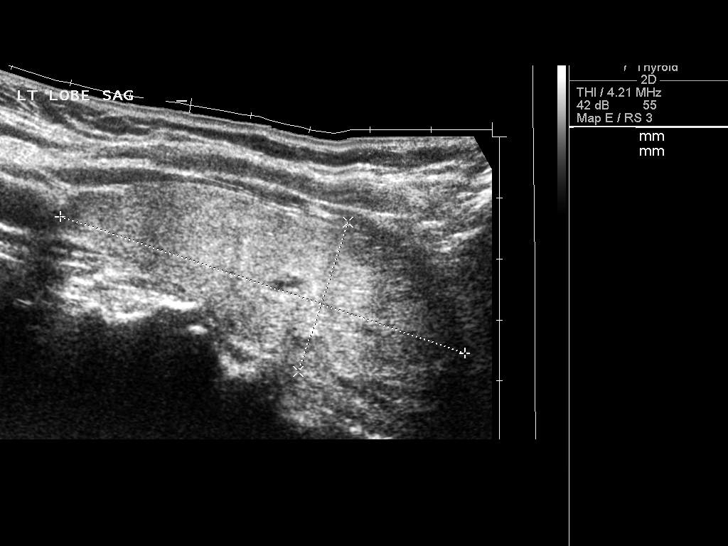
[im 19/29]
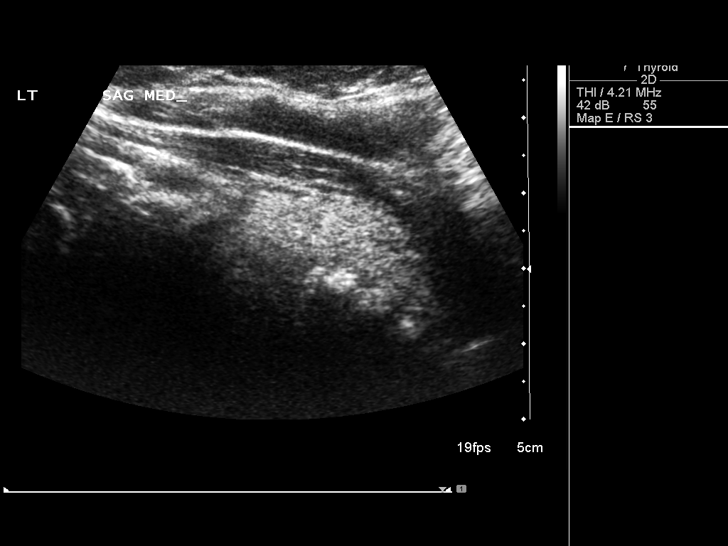
[im 22/29]
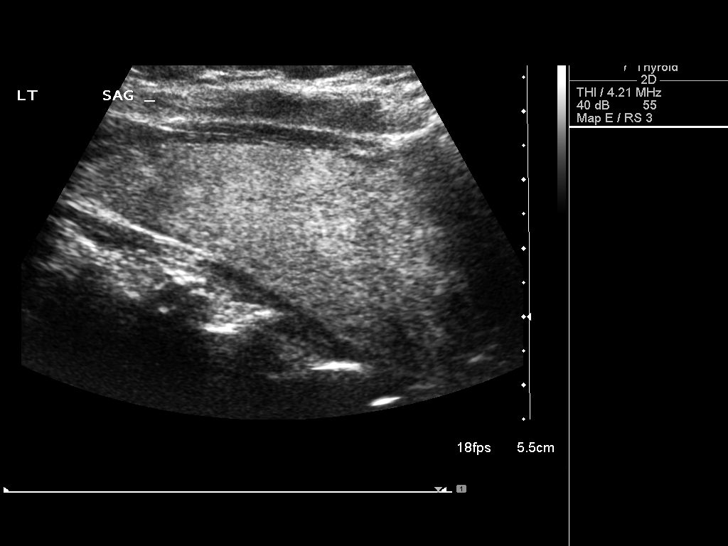
[im 24/29]
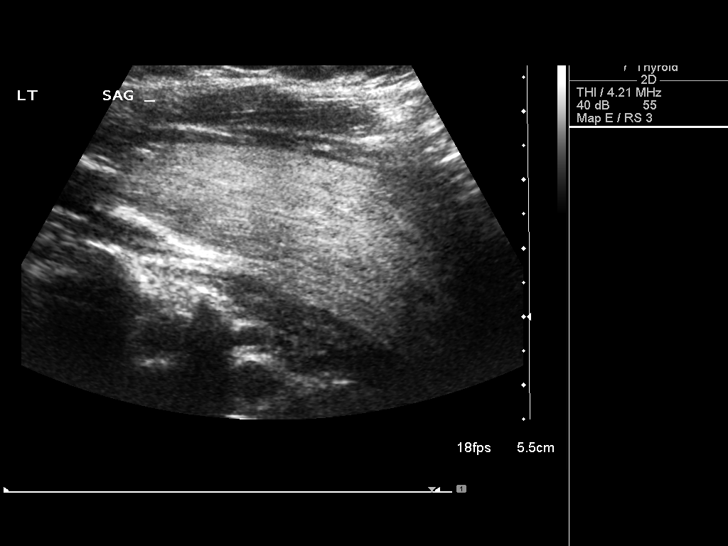
[im 26/29]
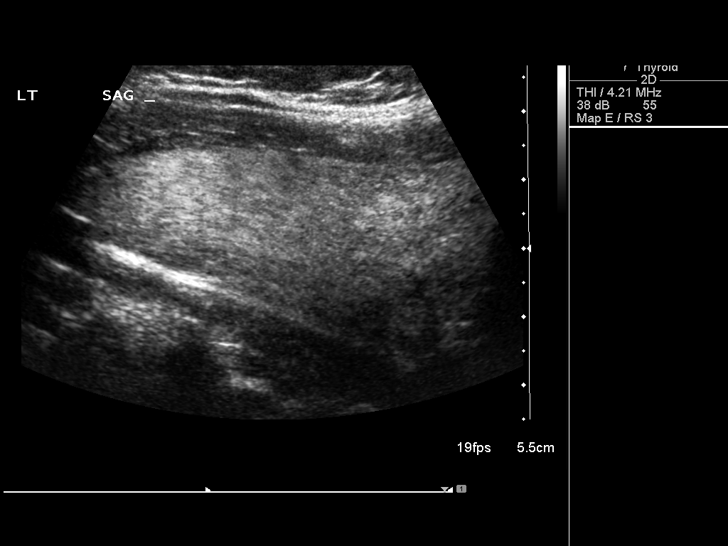
[im 29/29]
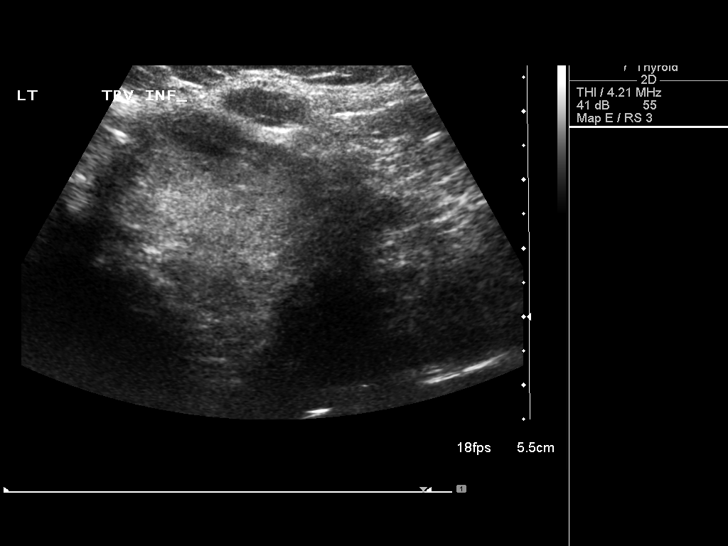

[14 of 25 positions shown; findings below may reference images not displayed]

FINDINGS: Right thyroid lobe

Measurements: 7.2 cm x 2.2 cm x 3.3 cm. Heterogeneous parenchyma of
the right thyroid lobe with no focal lesion identified.

Left thyroid lobe

Measurements: 7.0 cm x 2.6 cm x 2.7 cm. Heterogeneous parenchyma of
the left thyroid lobe without focal nodule.

Isthmus

Thickness: 3 mm.  No nodules visualized.

Lymphadenopathy

None visualized.
IMPRESSION: Enlarged thyroid without focal nodules.

## 2017-10-17 ENCOUNTER — Encounter: Payer: Self-pay | Admitting: Podiatry

## 2017-10-17 ENCOUNTER — Ambulatory Visit (INDEPENDENT_AMBULATORY_CARE_PROVIDER_SITE_OTHER): Payer: 59 | Admitting: Podiatry

## 2017-10-17 ENCOUNTER — Other Ambulatory Visit: Payer: Self-pay | Admitting: Podiatry

## 2017-10-17 ENCOUNTER — Ambulatory Visit (INDEPENDENT_AMBULATORY_CARE_PROVIDER_SITE_OTHER): Payer: 59

## 2017-10-17 DIAGNOSIS — M79672 Pain in left foot: Secondary | ICD-10-CM

## 2017-10-17 DIAGNOSIS — M779 Enthesopathy, unspecified: Secondary | ICD-10-CM

## 2017-10-17 DIAGNOSIS — B351 Tinea unguium: Secondary | ICD-10-CM

## 2017-10-17 DIAGNOSIS — M79671 Pain in right foot: Secondary | ICD-10-CM

## 2017-11-05 HISTORY — PX: COLONOSCOPY WITH PROPOFOL: SHX5780

## 2017-11-27 ENCOUNTER — Other Ambulatory Visit (HOSPITAL_COMMUNITY)
Admission: RE | Admit: 2017-11-27 | Discharge: 2017-11-27 | Disposition: A | Discharge status: Home / Self Care | Payer: 59 | Source: Ambulatory Visit | Attending: Nurse Practitioner | Admitting: Nurse Practitioner

## 2017-11-27 ENCOUNTER — Other Ambulatory Visit: Payer: Self-pay | Admitting: Nurse Practitioner

## 2017-11-27 DIAGNOSIS — Z124 Encounter for screening for malignant neoplasm of cervix: Secondary | ICD-10-CM | POA: Insufficient documentation

## 2017-12-03 LAB — CYTOLOGY - PAP
DIAGNOSIS: NEGATIVE
HPV: NOT DETECTED

## 2020-11-03 ENCOUNTER — Other Ambulatory Visit: Payer: Self-pay | Admitting: Obstetrics and Gynecology

## 2020-11-05 DIAGNOSIS — M109 Gout, unspecified: Secondary | ICD-10-CM

## 2020-11-05 HISTORY — DX: Gout, unspecified: M10.9

## 2020-11-24 ENCOUNTER — Other Ambulatory Visit (HOSPITAL_COMMUNITY)
Admission: RE | Admit: 2020-11-24 | Discharge: 2020-11-24 | Disposition: A | Discharge status: Home / Self Care | Payer: 59 | Source: Ambulatory Visit | Attending: Surgery | Admitting: Surgery

## 2020-11-24 ENCOUNTER — Encounter (HOSPITAL_BASED_OUTPATIENT_CLINIC_OR_DEPARTMENT_OTHER): Payer: Self-pay | Admitting: Surgery

## 2020-11-24 ENCOUNTER — Other Ambulatory Visit: Payer: Self-pay

## 2020-11-24 ENCOUNTER — Ambulatory Visit: Payer: Self-pay | Admitting: Surgery

## 2020-11-24 DIAGNOSIS — U071 COVID-19: Secondary | ICD-10-CM | POA: Insufficient documentation

## 2020-11-24 DIAGNOSIS — Z01812 Encounter for preprocedural laboratory examination: Secondary | ICD-10-CM | POA: Diagnosis present

## 2020-11-24 HISTORY — DX: COVID-19: U07.1

## 2020-11-24 NOTE — H&P (Signed)
CC: Referred for right buttock discomfort  HPI: Nina Braun is a very pleasant 9yoF with hx of HTN referred for evaluation of some discomfort that she is noticed for the last 4-6 months around the right buttock. She denies any history of drainage from this area or persistent/ongoing pain. She probably notices some discomfort with prolonged sitting in a hard chair such as when she is working on her computer. She denies any history of anorectal procedures or surgeries. She does report that she had a colonoscopy at the age of 82 and that it was reportedly normal and she was told to have a repeat scope in 10 years.  PMH: HTN  PSH: Denies any prior anorectal procedures or surgeries  FHx: Denies FHx of colorectal, breast, endometrial, ovarian or cervical cancer  Social: Denies use of tobacco/drugs; social EtOH use; she works at the [employer]  ROS: A comprehensive 10 system review of systems was completed with the patient and pertinent findings as noted above.  The patient is a 55 year old female.   Past Surgical History Nina Lorenzo, LPN; 16/11/958 4:54 PM) No pertinent past surgical history   Diagnostic Studies History Nina Lorenzo, LPN; 07/13/1190 4:78 PM) Colonoscopy  1-5 years ago Mammogram  within last year Pap Smear  1-5 years ago  Allergies Nina Lorenzo, LPN; 29/03/6212 0:86 PM) No Known Drug Allergies  [10/10/2020]: Allergies Reconciled   Medication History Nina Lorenzo, LPN; 57/06/4695 2:95 PM) Lisinopril-hydroCHLOROthiazide (20-12.5MG  Tablet, Oral) Active. Medications Reconciled  Social History Nina Lorenzo, LPN; 28/02/1323 4:01 PM) Alcohol use  Occasional alcohol use. No caffeine use  No drug use  Tobacco use  Former smoker.  Family History Nina Lorenzo, LPN; 12/12/2534 6:44 PM) Arthritis  Mother. Diabetes Mellitus  Brother, Father, Mother. Heart disease in female family member before age 41  Hypertension  Father, Mother, Sister. Kidney  Disease  Father.  Pregnancy / Birth History Nina Lorenzo, LPN; 01/07/7424 9:56 PM) Age at menarche  45 years. Age of menopause  36-50 Contraceptive History  Oral contraceptives. Gravida  0 Para  0  Other Problems Nina Lorenzo, LPN; 38/05/5642 3:29 PM) High blood pressure     Review of Systems Nina Billings Dockery LPN; 51/06/8415 6:06 PM) General Not Present- Appetite Loss, Chills, Fatigue, Fever, Night Sweats, Weight Gain and Weight Loss. Skin Not Present- Change in Wart/Mole, Dryness, Hives, Jaundice, New Lesions, Non-Healing Wounds, Rash and Ulcer. HEENT Not Present- Earache, Hearing Loss, Hoarseness, Nose Bleed, Oral Ulcers, Ringing in the Ears, Seasonal Allergies, Sinus Pain, Sore Throat, Visual Disturbances, Wears glasses/contact lenses and Yellow Eyes. Respiratory Not Present- Bloody sputum, Chronic Cough, Difficulty Breathing, Snoring and Wheezing. Breast Not Present- Breast Mass, Breast Pain, Nipple Discharge and Skin Changes. Cardiovascular Not Present- Chest Pain, Difficulty Breathing Lying Down, Leg Cramps, Palpitations, Rapid Heart Rate, Shortness of Breath and Swelling of Extremities. Gastrointestinal Not Present- Abdominal Pain, Bloating, Bloody Stool, Change in Bowel Habits, Chronic diarrhea, Constipation, Difficulty Swallowing, Excessive gas, Gets full quickly at meals, Hemorrhoids, Indigestion, Nausea, Rectal Pain and Vomiting. Female Genitourinary Not Present- Frequency, Nocturia, Painful Urination, Pelvic Pain and Urgency. Musculoskeletal Not Present- Back Pain, Joint Pain, Joint Stiffness, Muscle Pain, Muscle Weakness and Swelling of Extremities. Neurological Not Present- Decreased Memory, Fainting, Headaches, Numbness, Seizures, Tingling, Tremor, Trouble walking and Weakness. Psychiatric Not Present- Anxiety, Bipolar, Change in Sleep Pattern, Depression, Fearful and Frequent crying. Endocrine Not Present- Cold Intolerance, Excessive Hunger, Hair Changes, Heat  Intolerance, Hot flashes and New Diabetes. Hematology Not Present- Blood Thinners, Easy Bruising, Excessive bleeding,  Gland problems, HIV and Persistent Infections.  Vitals Nina Billings Dockery LPN; 70/04/2375 2:83 PM) 10/10/2020 4:11 PM Weight: 261 lb Height: 69in Body Surface Area: 2.31 m Body Mass Index: 38.54 kg/m  Temp.: 28F (Infrared)  Pulse: 100 (Regular)  BP: 126/88(Sitting, Left Arm, Standard)       Physical Exam Nina Braun M. Masen Salvas MD; 10/10/2020 4:26 PM) The physical exam findings are as follows: Note: Constitutional: No acute distress; conversant; no deformities; wearing mask Eyes: Moist conjunctiva; no lid lag; anicteric sclerae; pupils equal and round Neck: Trachea midline; no palpable thyromegaly Lungs: Normal respiratory effort; no tactile fremitus CV: rrr; no palpable thrill; no pitting edema GI: Abdomen soft, nontender, nondistended; no palpable hepatosplenomegaly Anorectal:Right lower buttock with soft, mobile somewhat pedunculated lobule consistent with lipoma; no surrounding skin changes or ulcerations. MSK: Normal gait; no clubbing/cyanosis Psychiatric: Appropriate affect; alert and oriented 3 Lymphatic: No palpable cervical or axillary lymphadenopathy **A chaperone, Nina Braun, was present for this encounter    Assessment & Plan Nina Braun M. Tanekia Ryans MD; 10/10/2020 4:30 PM) LIPOMA (D17.9) Story: Ms. Ferraiolo is a very pleasant 17yoF with hx of HTN - here for evaluation of symptomatic right buttock/upper thigh lipoma Impression: -The relevant anatomy and physiology was discussed today. The pathophysiology of soft tissue masses and lipoma was discussed as well -Given her symptoms, she is interested in having this removed. We discussed excision of lipoma. -The planned procedure, material risks (including, but not limited to, pain, bleeding, infection, scarring, hematoma, seroma, recurrence, need for additional procedures, pneumonia, heart attack,  stroke, death) benefits and alternatives to surgery were discussed at length. I noted a good probability that the procedure would help improve her symptoms. The patient's questions were answered to her satisfaction, she voiced understanding and elected to proceed with surgery. Additionally, we discussed typical postoperative expectations and the recovery process. -Requesting to have things done after the Holidays - ideally sometime in January 2022  This patient encounter took 32 minutes today to perform the following: take history, perform exam, review outside records, interpret imaging, counsel the patient on their diagnosis and document encounter, findings & plan in the EHR  Signed by Ileana Roup, MD (10/10/2020 4:31 PM)

## 2020-11-24 NOTE — Progress Notes (Signed)
Spoke w/ via phone for pre-op interview--- PT Lab needs dos----CBC, BMP, EKG               Lab results------ no COVID test ------ done 11-24-2020 result in epic Arrive at ------- 0730 NPO after MN NO Solid Food.  Clear liquids from MN until--- 0630 Medications to take morning of surgery ----- NONE Diabetic medication ----- n/a Patient Special Instructions ----- n/a Pre-Op special Istructions ----- n/a Patient verbalized understanding of instructions that were given at this phone interview. Patient denies shortness of breath, chest pain, fever, cough at this phone interview.

## 2020-11-25 LAB — SARS CORONAVIRUS 2 (TAT 6-24 HRS): SARS Coronavirus 2: POSITIVE — AB

## 2020-11-25 NOTE — Progress Notes (Signed)
Spoke with Earnest Bailey at Dr. Orest Dikes office to inform them that the patient tested positive for covid.

## 2020-11-25 NOTE — Progress Notes (Signed)
Spoke with Nina Braun at [facility] patient covid positive 11-24-2020, Nina Braun to make dr white aware.

## 2020-11-28 ENCOUNTER — Ambulatory Visit (HOSPITAL_BASED_OUTPATIENT_CLINIC_OR_DEPARTMENT_OTHER): Admission: RE | Admit: 2020-11-28 | Payer: 59 | Source: Home / Self Care | Admitting: Surgery

## 2020-11-28 HISTORY — DX: Unspecified osteoarthritis, unspecified site: M19.90

## 2020-11-28 HISTORY — DX: Benign lipomatous neoplasm, unspecified: D17.9

## 2020-11-28 HISTORY — DX: Personal history of other diseases of the circulatory system: Z86.79

## 2020-11-28 SURGERY — EXCISION LIPOMA
Anesthesia: General | Laterality: Right

## 2021-08-22 ENCOUNTER — Ambulatory Visit: Payer: Self-pay | Admitting: Surgery

## 2021-08-22 DIAGNOSIS — Z01818 Encounter for other preprocedural examination: Secondary | ICD-10-CM

## 2021-08-25 ENCOUNTER — Encounter (HOSPITAL_BASED_OUTPATIENT_CLINIC_OR_DEPARTMENT_OTHER): Payer: Self-pay | Admitting: Surgery

## 2021-08-25 ENCOUNTER — Other Ambulatory Visit: Payer: Self-pay

## 2021-08-25 DIAGNOSIS — Z87898 Personal history of other specified conditions: Secondary | ICD-10-CM

## 2021-08-25 HISTORY — DX: Personal history of other specified conditions: Z87.898

## 2021-08-25 NOTE — Progress Notes (Signed)
Spoke w/ via phone for pre-op interview---patient Lab needs dos---- CBC, CMP, EKG, urine pregnancy (patient had spotting 10/2020)              Lab results------none COVID test -----patient states asymptomatic no test needed Arrive at -------1000 on 08/28/21 NPO after MN NO Solid Food.  Clear liquids from MN until---0900 Med rec completed Medications to take morning of surgery -----none Diabetic medication -----none Patient instructed no nail polish to be worn day of surgery Patient instructed to bring photo id and insurance card day of surgery Patient aware to have Driver (ride ) / caregiver    for 24 hours after surgery  - Nina Braun Patient Special Instructions -----none Pre-Op special Istructions -----none Patient verbalized understanding of instructions that were given at this phone interview. Patient denies shortness of breath, chest pain, fever, cough at this phone interview.

## 2021-08-28 ENCOUNTER — Encounter (HOSPITAL_BASED_OUTPATIENT_CLINIC_OR_DEPARTMENT_OTHER)
Admission: RE | Disposition: A | Discharge status: Home / Self Care | Payer: Self-pay | Source: Home / Self Care | Attending: Surgery

## 2021-08-28 ENCOUNTER — Ambulatory Visit (HOSPITAL_BASED_OUTPATIENT_CLINIC_OR_DEPARTMENT_OTHER): Payer: 59 | Admitting: Anesthesiology

## 2021-08-28 ENCOUNTER — Encounter (HOSPITAL_BASED_OUTPATIENT_CLINIC_OR_DEPARTMENT_OTHER): Payer: Self-pay | Admitting: Surgery

## 2021-08-28 ENCOUNTER — Ambulatory Visit (HOSPITAL_BASED_OUTPATIENT_CLINIC_OR_DEPARTMENT_OTHER)
Admission: RE | Admit: 2021-08-28 | Discharge: 2021-08-28 | Disposition: A | Discharge status: Home / Self Care | Payer: 59 | Attending: Surgery | Admitting: Surgery

## 2021-08-28 DIAGNOSIS — Z8616 Personal history of COVID-19: Secondary | ICD-10-CM | POA: Insufficient documentation

## 2021-08-28 DIAGNOSIS — Z01818 Encounter for other preprocedural examination: Secondary | ICD-10-CM

## 2021-08-28 DIAGNOSIS — Z87891 Personal history of nicotine dependence: Secondary | ICD-10-CM | POA: Diagnosis not present

## 2021-08-28 DIAGNOSIS — R222 Localized swelling, mass and lump, trunk: Secondary | ICD-10-CM | POA: Diagnosis present

## 2021-08-28 DIAGNOSIS — I10 Essential (primary) hypertension: Secondary | ICD-10-CM | POA: Insufficient documentation

## 2021-08-28 HISTORY — PX: MASS EXCISION: SHX2000

## 2021-08-28 LAB — COMPREHENSIVE METABOLIC PANEL
ALT: 16 U/L (ref 0–44)
AST: 18 U/L (ref 15–41)
Albumin: 3.8 g/dL (ref 3.5–5.0)
Alkaline Phosphatase: 54 U/L (ref 38–126)
Anion gap: 7 (ref 5–15)
BUN: 17 mg/dL (ref 6–20)
CO2: 26 mmol/L (ref 22–32)
Calcium: 9 mg/dL (ref 8.9–10.3)
Chloride: 104 mmol/L (ref 98–111)
Creatinine, Ser: 0.68 mg/dL (ref 0.44–1.00)
GFR, Estimated: >60 mL/min (ref >60)
Glucose, Bld: 87 mg/dL (ref 70–99)
Potassium: 3.5 mmol/L (ref 3.5–5.1)
Sodium: 137 mmol/L (ref 135–145)
Total Bilirubin: 0.8 mg/dL (ref 0.3–1.2)
Total Protein: 7.5 g/dL (ref 6.5–8.1)

## 2021-08-28 LAB — CBC WITH DIFFERENTIAL/PLATELET
Abs Immature Granulocytes: 0.01 10*3/uL (ref 0.00–0.07)
Basophils Absolute: 0 10*3/uL (ref 0.0–0.1)
Basophils Relative: 0 %
Eosinophils Absolute: 0.1 10*3/uL (ref 0.0–0.5)
Eosinophils Relative: 1 %
HCT: 37.2 % (ref 36.0–46.0)
Hemoglobin: 12 g/dL (ref 12.0–15.0)
Immature Granulocytes: 0 %
Lymphocytes Relative: 43 %
Lymphs Abs: 2.4 10*3/uL (ref 0.7–4.0)
MCH: 30.3 pg (ref 26.0–34.0)
MCHC: 32.3 g/dL (ref 30.0–36.0)
MCV: 93.9 fL (ref 80.0–100.0)
Monocytes Absolute: 0.4 10*3/uL (ref 0.1–1.0)
Monocytes Relative: 6 %
Neutro Abs: 2.8 10*3/uL (ref 1.7–7.7)
Neutrophils Relative %: 50 %
Platelets: 250 10*3/uL (ref 150–400)
RBC: 3.96 MIL/uL (ref 3.87–5.11)
RDW: 12.8 % (ref 11.5–15.5)
WBC: 5.6 10*3/uL (ref 4.0–10.5)
nRBC: 0 % (ref 0.0–0.2)

## 2021-08-28 SURGERY — EXCISION MASS
Anesthesia: Monitor Anesthesia Care | Site: Buttocks | Laterality: Right

## 2021-08-28 MED ORDER — ONDANSETRON HCL 4 MG/2ML IJ SOLN: 4.0000 mg | Freq: Once | INTRAMUSCULAR | Status: DC | PRN

## 2021-08-28 MED ORDER — CEFAZOLIN SODIUM-DEXTROSE 2-4 GM/100ML-% IV SOLN
2.0000 g | INTRAVENOUS | Status: AC
  Administered 2021-08-28: 2 g via INTRAVENOUS

## 2021-08-28 MED ORDER — DEXAMETHASONE SODIUM PHOSPHATE 4 MG/ML IJ SOLN
INTRAMUSCULAR | Status: DC | PRN
  Administered 2021-08-28: 5 mg via INTRAVENOUS

## 2021-08-28 MED ORDER — ACETAMINOPHEN 500 MG PO TABS
ORAL_TABLET | ORAL | Status: AC
  Filled 2021-08-28: qty 2

## 2021-08-28 MED ORDER — FENTANYL CITRATE (PF) 100 MCG/2ML IJ SOLN
INTRAMUSCULAR | Status: DC | PRN
  Administered 2021-08-28 (×6): 25 ug via INTRAVENOUS

## 2021-08-28 MED ORDER — CHLORHEXIDINE GLUCONATE CLOTH 2 % EX PADS: 6.0000 | MEDICATED_PAD | Freq: Once | CUTANEOUS | Status: DC

## 2021-08-28 MED ORDER — FENTANYL CITRATE (PF) 100 MCG/2ML IJ SOLN: 25.0000 ug | INTRAMUSCULAR | Status: DC | PRN

## 2021-08-28 MED ORDER — DEXAMETHASONE SODIUM PHOSPHATE 10 MG/ML IJ SOLN
INTRAMUSCULAR | Status: AC
  Filled 2021-08-28: qty 1

## 2021-08-28 MED ORDER — MIDAZOLAM HCL 2 MG/2ML IJ SOLN
INTRAMUSCULAR | Status: AC
  Filled 2021-08-28: qty 2

## 2021-08-28 MED ORDER — OXYCODONE HCL 5 MG/5ML PO SOLN: 5.0000 mg | Freq: Once | ORAL | Status: DC | PRN

## 2021-08-28 MED ORDER — KETAMINE HCL 10 MG/ML IJ SOLN
INTRAMUSCULAR | Status: DC | PRN
  Administered 2021-08-28 (×5): 10 mg via INTRAVENOUS

## 2021-08-28 MED ORDER — BUPIVACAINE LIPOSOME 1.3 % IJ SUSP
INTRAMUSCULAR | Status: DC | PRN
  Administered 2021-08-28: 20 mL

## 2021-08-28 MED ORDER — LIDOCAINE 2% (20 MG/ML) 5 ML SYRINGE
INTRAMUSCULAR | Status: AC
  Filled 2021-08-28: qty 5

## 2021-08-28 MED ORDER — ACETAMINOPHEN 500 MG PO TABS
1000.0000 mg | ORAL_TABLET | ORAL | Status: AC
  Administered 2021-08-28: 1000 mg via ORAL

## 2021-08-28 MED ORDER — KETOROLAC TROMETHAMINE 30 MG/ML IJ SOLN
INTRAMUSCULAR | Status: AC
  Filled 2021-08-28: qty 1

## 2021-08-28 MED ORDER — PROPOFOL 10 MG/ML IV BOLUS
INTRAVENOUS | Status: AC
  Filled 2021-08-28: qty 20

## 2021-08-28 MED ORDER — PROPOFOL 10 MG/ML IV BOLUS
INTRAVENOUS | Status: DC | PRN
  Administered 2021-08-28 (×2): 20 mg via INTRAVENOUS

## 2021-08-28 MED ORDER — MIDAZOLAM HCL 5 MG/5ML IJ SOLN
INTRAMUSCULAR | Status: DC | PRN
  Administered 2021-08-28: 1 mg via INTRAVENOUS
  Administered 2021-08-28: 2 mg via INTRAVENOUS
  Administered 2021-08-28: 1 mg via INTRAVENOUS

## 2021-08-28 MED ORDER — OXYCODONE HCL 5 MG PO TABS: 5.0000 mg | ORAL_TABLET | Freq: Once | ORAL | Status: DC | PRN

## 2021-08-28 MED ORDER — LIDOCAINE HCL (CARDIAC) PF 100 MG/5ML IV SOSY
PREFILLED_SYRINGE | INTRAVENOUS | Status: DC | PRN
  Administered 2021-08-28: 40 mg via INTRAVENOUS
  Administered 2021-08-28: 60 mg via INTRAVENOUS

## 2021-08-28 MED ORDER — 0.9 % SODIUM CHLORIDE (POUR BTL) OPTIME
TOPICAL | Status: DC | PRN
  Administered 2021-08-28: 500 mL

## 2021-08-28 MED ORDER — PROPOFOL 500 MG/50ML IV EMUL
INTRAVENOUS | Status: DC | PRN
  Administered 2021-08-28: 50 ug/kg/min via INTRAVENOUS

## 2021-08-28 MED ORDER — FENTANYL CITRATE (PF) 250 MCG/5ML IJ SOLN
INTRAMUSCULAR | Status: AC
  Filled 2021-08-28: qty 5

## 2021-08-28 MED ORDER — KETAMINE HCL 50 MG/5ML IJ SOSY
PREFILLED_SYRINGE | INTRAMUSCULAR | Status: AC
  Filled 2021-08-28: qty 5

## 2021-08-28 MED ORDER — ONDANSETRON HCL 4 MG/2ML IJ SOLN
INTRAMUSCULAR | Status: DC | PRN
  Administered 2021-08-28: 4 mg via INTRAVENOUS

## 2021-08-28 MED ORDER — TRAMADOL HCL 50 MG PO TABS: 50.0000 mg | ORAL_TABLET | Freq: Four times a day (QID) | ORAL | 0 refills | Status: AC | PRN

## 2021-08-28 MED ORDER — BUPIVACAINE-EPINEPHRINE (PF) 0.25% -1:200000 IJ SOLN
INTRAMUSCULAR | Status: DC | PRN
  Administered 2021-08-28: 30 mL via PERINEURAL

## 2021-08-28 MED ORDER — ONDANSETRON HCL 4 MG/2ML IJ SOLN
INTRAMUSCULAR | Status: AC
  Filled 2021-08-28: qty 2

## 2021-08-28 MED ORDER — LACTATED RINGERS IV SOLN: INTRAVENOUS | Status: DC

## 2021-08-28 MED ORDER — KETOROLAC TROMETHAMINE 30 MG/ML IJ SOLN
INTRAMUSCULAR | Status: DC | PRN
  Administered 2021-08-28: 30 mg via INTRAVENOUS

## 2021-08-28 MED ORDER — CEFAZOLIN SODIUM-DEXTROSE 2-4 GM/100ML-% IV SOLN
INTRAVENOUS | Status: AC
  Filled 2021-08-28: qty 100

## 2021-08-28 SURGICAL SUPPLY — 43 items
ADH SKN CLS APL DERMABOND .7 (GAUZE/BANDAGES/DRESSINGS) ×1
BLADE CLIPPER SENSICLIP SURGIC (BLADE) IMPLANT
BLADE SURG 15 STRL LF DISP TIS (BLADE) ×1 IMPLANT
BLADE SURG 15 STRL SS (BLADE) ×2
BNDG GAUZE ELAST 4 BULKY (GAUZE/BANDAGES/DRESSINGS) IMPLANT
COVER BACK TABLE 60X90IN (DRAPES) IMPLANT
COVER MAYO STAND STRL (DRAPES) ×2 IMPLANT
DERMABOND ADVANCED (GAUZE/BANDAGES/DRESSINGS) ×1
DERMABOND ADVANCED .7 DNX12 (GAUZE/BANDAGES/DRESSINGS) IMPLANT
DRAPE LAPAROTOMY 100X72 PEDS (DRAPES) IMPLANT
DRAPE UTILITY XL STRL (DRAPES) ×2 IMPLANT
ELECT REM PT RETURN 9FT ADLT (ELECTROSURGICAL) ×2
ELECTRODE REM PT RTRN 9FT ADLT (ELECTROSURGICAL) ×1 IMPLANT
GAUZE 4X4 16PLY ~~LOC~~+RFID DBL (SPONGE) ×2 IMPLANT
GAUZE SPONGE 4X4 12PLY STRL (GAUZE/BANDAGES/DRESSINGS) IMPLANT
GLOVE SRG 8 PF TXTR STRL LF DI (GLOVE) ×1 IMPLANT
GLOVE SURG ENC MOIS LTX SZ7.5 (GLOVE) ×2 IMPLANT
GLOVE SURG POLYISO LF SZ6 (GLOVE) ×1 IMPLANT
GLOVE SURG UNDER POLY LF SZ6 (GLOVE) ×1 IMPLANT
GLOVE SURG UNDER POLY LF SZ7.5 (GLOVE) ×1 IMPLANT
GLOVE SURG UNDER POLY LF SZ8 (GLOVE) ×2
GOWN STRL REUS W/TWL LRG LVL3 (GOWN DISPOSABLE) ×2 IMPLANT
KIT TURNOVER CYSTO (KITS) ×2 IMPLANT
NDL HYPO 25X1 1.5 SAFETY (NEEDLE) IMPLANT
NEEDLE HYPO 22GX1.5 SAFETY (NEEDLE) IMPLANT
NEEDLE HYPO 25X1 1.5 SAFETY (NEEDLE) ×2 IMPLANT
NS IRRIG 1000ML POUR BTL (IV SOLUTION) ×1 IMPLANT
NS IRRIG 500ML POUR BTL (IV SOLUTION) ×1 IMPLANT
PACK BASIN DAY SURGERY FS (CUSTOM PROCEDURE TRAY) ×2 IMPLANT
PENCIL SMOKE EVACUATOR (MISCELLANEOUS) ×2 IMPLANT
SPONGE T-LAP 18X18 ~~LOC~~+RFID (SPONGE) IMPLANT
SPONGE T-LAP 4X18 ~~LOC~~+RFID (SPONGE) IMPLANT
SUT ETHILON 3 0 FSL (SUTURE) ×1 IMPLANT
SUT MNCRL AB 4-0 PS2 18 (SUTURE) ×1 IMPLANT
SUT VIC AB 3-0 SH 27 (SUTURE) ×4
SUT VIC AB 3-0 SH 27X BRD (SUTURE) IMPLANT
SUT VIC AB 3-0 SH 27XBRD (SUTURE) IMPLANT
SYR BULB EAR ULCER 3OZ GRN STR (SYRINGE) ×3 IMPLANT
SYR CONTROL 10ML LL (SYRINGE) ×2 IMPLANT
TOWEL OR 17X26 10 PK STRL BLUE (TOWEL DISPOSABLE) ×2 IMPLANT
TRAY DSU PREP LF (CUSTOM PROCEDURE TRAY) ×2 IMPLANT
UNDERPAD 30X36 HEAVY ABSORB (UNDERPADS AND DIAPERS) ×2 IMPLANT
YANKAUER SUCT BULB TIP NO VENT (SUCTIONS) ×2 IMPLANT

## 2021-08-28 NOTE — Discharge Instructions (Addendum)
POST OP INSTRUCTIONS  DIET: As tolerated. Follow a light bland diet the first 24 hours after arrival home, such as soup, liquids, crackers, etc.  Be sure to include lots of fluids daily.  Avoid fast food or heavy meals as your are more likely to get nauseated.  Eat a low fat the next few days after surgery.  Take your usually prescribed home medications unless otherwise directed.  PAIN CONTROL: Pain is best controlled by a usual combination of three different methods TOGETHER: Ice/Heat Over the counter pain medication Prescription pain medication Most patients will experience some swelling and bruising around the surgical site.  Ice packs or heating pads (30-60 minutes up to 6 times a day) will help. Some people prefer to use ice alone, heat alone, alternating between ice & heat.  Experiment to what works for you.  Swelling and bruising can take several weeks to resolve.   It is helpful to take an over-the-counter pain medication regularly for the first few weeks: Ibuprofen (Motrin/Advil) - 200mg  tabs - take 3 tabs (600mg ) every 6 hours as needed for pain Acetaminophen (Tylenol) - you may take 650mg  every 6 hours as needed. You can take this with motrin as they act differently on the body. If you are taking a narcotic pain medication that has acetaminophen in it, do not take over the counter tylenol at the same time.  Iii. NOTE: You may take both of these medications together - most patients  find it most helpful when alternating between the two (i.e. Ibuprofen at 6am,  tylenol at 9am, ibuprofen at 12pm ..Marland Kitchen) A  prescription for pain medication should be given to you upon discharge.  Take your pain medication as prescribed if your pain is not adequatly controlled with the over-the-counter pain reliefs mentioned above.  Avoid getting constipated.  Between the surgery and the pain medications, it is common to experience some constipation.  Increasing fluid intake and taking a fiber supplement (such as  Metamucil, Citrucel, FiberCon, MiraLax, etc) 1-2 times a day regularly will usually help prevent this problem from occurring.  A mild laxative (prune juice, Milk of Magnesia, MiraLax, etc) should be taken according to package directions if there are no bowel movements after 48 hours.    Dressing: Your incision is covered in Dermabond which is like sterile superglue for the skin. This will come off on it's own in a couple weeks. It is waterproof and you may bathe normally starting the day after your surgery in a shower. Avoid baths/pools/lakes/oceans until your wounds have fully healed.  ACTIVITIES as tolerated:   You may resume regular (light) daily activities beginning the next day--such as daily self-care, walking, climbing stairs--gradually increasing activities as tolerated.  If you can walk 30 minutes without difficulty, it is safe to try more intense activity such as jogging, treadmill, bicycling, low-impact aerobics.  DO NOT PUSH THROUGH PAIN.  Let pain be your guide: If it hurts to do something, don't do it. You may drive when you are no longer taking prescription pain medication, you can comfortably wear a seatbelt, and you can safely maneuver your car and apply brakes.   FOLLOW UP in our office Please call [facility] at [phone] to set up an appointment to see your surgeon in the office for a follow-up appointment approximately 2 weeks after your surgery. Make sure that you call for this appointment the day you arrive home to insure a convenient appointment time.  9. If you have disability or family leave  forms that need to be completed, you may have them completed by your primary care physician's office; for return to work instructions, please ask our office staff and they will be happy to assist you in obtaining this documentation   When to call us [phone]: Poor pain control Reactions / problems with new medications (rash/itching, etc)  Fever over 101.5 F (38.5 C) Inability to  urinate Nausea/vomiting Worsening swelling or bruising Continued bleeding from incision. Increased pain, redness, or drainage from the incision  The clinic staff is available to answer your questions during regular business hours (8:30am-5pm).  Please don't hesitate to call and ask to speak to one of our nurses for clinical concerns.   A surgeon from [facility] is always on call at the hospitals   If you have a medical emergency, go to the nearest emergency room or call 911.  [facility] [address] [place], [place], [place]  [address] MAIN: [phone] FAX: [phone] [email or web address]  Information for Discharge Teaching: EXPAREL (bupivacaine liposome injectable suspension)   Your surgeon or anesthesiologist gave you EXPAREL(bupivacaine) to help control your pain after surgery.  EXPAREL is a local anesthetic that provides pain relief by numbing the tissue around the surgical site. EXPAREL is designed to release pain medication over time and can control pain for up to 72 hours. Depending on how you respond to EXPAREL, you may require less pain medication during your recovery.  Possible side effects: Temporary loss of sensation or ability to move in the area where bupivacaine was injected. Nausea, vomiting, constipation Rarely, numbness and tingling in your mouth or lips, lightheadedness, or anxiety may occur. Call your doctor right away if you think you may be experiencing any of these sensations, or if you have other questions regarding possible side effects.  Follow all other discharge instructions given to you by your surgeon or nurse. Eat a healthy diet and drink plenty of water or other fluids.  If you return to the hospital for any reason within 96 hours following the administration of EXPAREL, it is important for health care providers to know that you have received this anesthetic. A teal colored  band has been placed on your arm with the date, time and amount of EXPAREL you have received in order to alert and inform your health care providers. Please leave this armband in place for the full 96 hours following administration, and then you may remove the band.  Post Anesthesia Home Care Instructions  Activity: Get plenty of rest for the remainder of the day. A responsible individual must stay with you for 24 hours following the procedure.  For the next 24 hours, DO NOT: -Drive a car -Paediatric nurse -Drink alcoholic beverages -Take any medication unless instructed by your physician -Make any legal decisions or sign important papers.  Meals: Start with liquid foods such as gelatin or soup. Progress to regular foods as tolerated. Avoid greasy, spicy, heavy foods. If nausea and/or vomiting occur, drink only clear liquids until the nausea and/or vomiting subsides. Call your physician if vomiting continues.  Special Instructions/Symptoms: Your throat may feel dry or sore from the anesthesia or the breathing tube placed in your throat during surgery. If this causes discomfort, gargle with warm salt water. The discomfort should disappear within 24 hours.  If you had a scopolamine patch placed behind your ear for the management of post- operative nausea and/or vomiting:  1. The medication in the patch is effective  for 72 hours, after which it should be removed.  Wrap patch in a tissue and discard in the trash. Wash hands thoroughly with soap and water. 2. You may remove the patch earlier than 72 hours if you experience unpleasant side effects which may include dry mouth, dizziness or visual disturbances. 3. Avoid touching the patch. Wash your hands with soap and water after contact with the patch.

## 2021-08-28 NOTE — Anesthesia Preprocedure Evaluation (Signed)
Anesthesia Evaluation  Patient identified by MRN, date of birth, ID band Patient awake    Reviewed: Allergy & Precautions, NPO status , Patient's Chart, lab work & pertinent test results  Airway Mallampati: II  TM Distance: >3 FB Neck ROM: Full    Dental  (+) Teeth Intact, Dental Advisory Given   Pulmonary former smoker,    Pulmonary exam normal breath sounds clear to auscultation       Cardiovascular hypertension, Pt. on medications Normal cardiovascular exam Rhythm:Regular Rate:Normal     Neuro/Psych negative neurological ROS  negative psych ROS   GI/Hepatic negative GI ROS, Neg liver ROS,   Endo/Other  Obesity Hx/o thyromegaly  Renal/GU negative Renal ROS  negative genitourinary   Musculoskeletal  (+) Arthritis , Soft tissue mass right thigh   Abdominal (+) + obese,   Peds  Hematology negative hematology ROS (+)   Anesthesia Other Findings   Reproductive/Obstetrics                             Anesthesia Physical Anesthesia Plan  ASA: 2  Anesthesia Plan: MAC   Post-op Pain Management:    Induction: Intravenous  PONV Risk Score and Plan: 3 and Treatment may vary due to age or medical condition, Ondansetron and Propofol infusion  Airway Management Planned: Natural Airway, Simple Face Mask and Nasal Cannula  Additional Equipment:   Intra-op Plan:   Post-operative Plan:   Informed Consent: I have reviewed the patients History and Physical, chart, labs and discussed the procedure including the risks, benefits and alternatives for the proposed anesthesia with the patient or authorized representative who has indicated his/her understanding and acceptance.     Dental advisory given  Plan Discussed with: CRNA and Anesthesiologist  Anesthesia Plan Comments:         Anesthesia Quick Evaluation

## 2021-08-28 NOTE — Op Note (Signed)
08/28/2021  1:04 PM  PATIENT:  Nina Braun  55 y.o. female  Patient Care Team: Leeroy Cha, MD as PCP - General (Internal Medicine)  PRE-OPERATIVE DIAGNOSIS:  Soft tissue mass, right buttock  POST-OPERATIVE DIAGNOSIS:  Same  PROCEDURE:  Excision of soft tissue mass from right buttock - involving subcutaneous fat, 5 x 4 x 5 cm in size  SURGEON:  Sharon Mt. Dema Severin, MD  ASSISTANT: Vernell Leep MD PGY-5  ANESTHESIA:   local and MAC  COUNTS:  Sponge, needle and instrument counts were reported correct x2 at the conclusion of the operation.  EBL: 5 mL  DRAINS: None  SPECIMEN: Right buttock soft tissue mass, short suture anterior, long suture lateral  COMPLICATIONS: None  FINDINGS: Soft tissue mass of right buttock - soft/mobile golf ball in size ~ 5 x 4 x 5 cm in size. Specimen oriented for pathology.  DISPOSITION: PACU in satisfactory condition  DESCRIPTION: The patient was identified in preop holding and taken to the OR where she was placed on the operating room table. SCDs were placed. MAC anesthesia was induced without difficulty. She was positioned in lithotomy with Allen stirrups. Pressure points were evaluated and padded. She was then prepped and draped in the usual sterile fashion. A surgical timeout was performed indicating the correct patient, procedure, positioning and need for preoperative antibiotics.   The mass is identified in the right buttock.  This is exophytic/extending in the borders of the normal skin around it.  This is soft and mobile.  Borders are marked for a planned elliptical incision within Langer's lines.  Local anesthetic is infiltrated consisting of 0.25% Marcaine with epinephrine and Exparel.  After ascertaining an appropriate level of anesthesia, the elliptical incision was created using a scalpel.  Dermis is incised.  The mass is then dissected from the underlying normal subcutaneous fat.  There is a component of this mass extending  into the subcutaneous tissue.  This does not involve muscle.  This is fully excised including the apparent pseudocapsule with it.  The lesion is oriented for pathology with a short suture on the anterior side and a long suture on the lateralmost aspect.  This is with her in the lithotomy position.  The wound is then irrigated.  Hemostasis is achieved with electrocautery.  The wound is then closed in layers consisting of 3-0 Vicryl deep dermal sutures followed by a running 4-0 Monocryl subcuticular suture.  The wound is able to brought together without any tension.  All counts were reported correct.  Dermabond is applied.  She is then taken out of lithotomy position, awakened from anesthesia, and transferred to a stretcher for transport to PACU in satisfactory condition.

## 2021-08-28 NOTE — H&P (Signed)
CC: Here today for surgery  Ms. Kawahara is a very pleasant 55yoF with history of HTN, whom was seen in the office originally as a referral by Dr. Fara Olden for evaluation of soft tissue mass of the inferior most aspect of the right buttock/upper thigh. This has been present for a year now. She was previously seen and scheduled for surgery for this but this was canceled secondary to her having had COVID. She declined to reschedule. She recently saw her PCP back and was advised to revisit [place]. She reports it has grown in size since we saw her last and has been causing her more discomfort with activity. She is interested in having this removed.  She denies any changes in her health or health hx since we met in the office. States she is ready for surgery.  PMH: HTN  PSH: Denies  FHx: Denies any known family history of colorectal, breast, endometrial or ovarian cancer  Social Hx: Denies use of tobacco/EtOH/illicit drug. she works at the post [employer]  Review of Systems: A complete review of systems was obtained from the patient. I have reviewed this information and discussed as appropriate with the patient. See HPI as well for other ROS.  Past Medical History:  Diagnosis Date   Arthritis    hands   Arthritis    right thumb   COVID-19 11/24/2020   no symptoms   Gout 2022   right great toe   History of palpitations 08/25/2021   Patient states that she had heart palpitations a few years ago. Her doctor advised her to cut out caffeine, chocolate, etc. She states no problems since she eliminated caffeine.   History of palpitations in adulthood    11-24-2020  per pt had palpitaions approx. 8 yrs ago, stated had work-up done was told everything ok,  stated had stopped drinking caffine and no issue since   Hypertension    followed by pcp   Lipoma    right buttock   Thyromegaly    [place] head/ neck in epic 10-26-2014 enlarged thyroid without nodules   Vitamin D deficient osteomalacia      Past Surgical History:  Procedure Laterality Date   COLONOSCOPY WITH PROPOFOL  2019   DILATION AND CURETTAGE OF UTERUS  12-21-2000  @[facility]    w/ suction    Family History  Problem Relation Age of Onset   Hypertension Mother    Heart disease Mother    Diabetes Mother    Hyperlipidemia Mother    Heart disease Father    Hypertension Father    Stroke Father    Hyperlipidemia Father    Allergies Sister    Hypertension Sister    Diabetes Brother    Cancer Paternal Grandfather    Diabetes Daughter     Social:  reports that she quit smoking about 12 years ago. Her smoking use included cigarettes. She has never used smokeless tobacco. She reports current alcohol use of about 7.0 standard drinks per week. She reports that she does not currently use drugs after having used the following drugs: Marijuana.  Allergies: No Known Allergies  Medications: I have reviewed the patient's current medications.  Results for orders placed or performed during the hospital encounter of 08/28/21 (from the past 48 hour(s))  CBC WITH DIFFERENTIAL     Status: None   Collection Time: 08/28/21 10:20 AM  Result Value Ref Range   WBC 5.6 4.0 - 10.5 K/uL   RBC 3.96 3.87 - 5.11 MIL/uL  Hemoglobin 12.0 12.0 - 15.0 g/dL   HCT 37.2 36.0 - 46.0 %   MCV 93.9 80.0 - 100.0 fL   MCH 30.3 26.0 - 34.0 pg   MCHC 32.3 30.0 - 36.0 g/dL   RDW 12.8 11.5 - 15.5 %   Platelets 250 150 - 400 K/uL   nRBC 0.0 0.0 - 0.2 %   Neutrophils Relative % 50 %   Neutro Abs 2.8 1.7 - 7.7 K/uL   Lymphocytes Relative 43 %   Lymphs Abs 2.4 0.7 - 4.0 K/uL   Monocytes Relative 6 %   Monocytes Absolute 0.4 0.1 - 1.0 K/uL   Eosinophils Relative 1 %   Eosinophils Absolute 0.1 0.0 - 0.5 K/uL   Basophils Relative 0 %   Basophils Absolute 0.0 0.0 - 0.1 K/uL   Immature Granulocytes 0 %   Abs Immature Granulocytes 0.01 0.00 - 0.07 K/uL    Comment: Performed at [facility], [place] [address] [place], [place] [address]     No results found.  ROS - all of the below systems have been reviewed with the patient and positives are indicated with bold text General: chills, fever or night sweats Eyes: blurry vision or double vision ENT: epistaxis or sore throat Allergy/Immunology: itchy/watery eyes or nasal congestion Hematologic/Lymphatic: bleeding problems, blood clots or swollen lymph nodes Endocrine: temperature intolerance or unexpected weight changes Breast: new or changing breast lumps or nipple discharge Resp: cough, shortness of breath, or wheezing CV: chest pain or dyspnea on exertion GI: as per HPI GU: dysuria, trouble voiding, or hematuria MSK: joint pain or joint stiffness Neuro: TIA or stroke symptoms Derm: pruritus and skin lesion changes Psych: anxiety and depression  PE Blood pressure (!) 164/85, pulse 76, temperature 98.4 F (36.9 C), temperature source Oral, resp. rate 17, height 5' 9"  (1.753 m), weight 112 kg, SpO2 100 %. Constitutional: NAD; conversant; wearing mask Eyes: Moist conjunctiva; no lid lag; anicteric Lungs: Normal respiratory effort CV: RRR; no pitting edema (GU copied from office note: Right lower buttock with soft, mobile somewhat pedunculated lobule consistent with lipoma; no surrounding skin changes or ulcerations. 4 x 4 x 4 cm in size) MSK: Normal range of motion of extremities Psychiatric: Appropriate affect; alert and oriented x3  Results for orders placed or performed during the hospital encounter of 08/28/21 (from the past 48 hour(s))  CBC WITH DIFFERENTIAL     Status: None   Collection Time: 08/28/21 10:20 AM  Result Value Ref Range   WBC 5.6 4.0 - 10.5 K/uL   RBC 3.96 3.87 - 5.11 MIL/uL   Hemoglobin 12.0 12.0 - 15.0 g/dL   HCT 37.2 36.0 - 46.0 %   MCV 93.9 80.0 - 100.0 fL   MCH 30.3 26.0 - 34.0 pg   MCHC 32.3 30.0 - 36.0 g/dL   RDW 12.8 11.5 - 15.5 %   Platelets 250 150 - 400 K/uL   nRBC 0.0 0.0 - 0.2 %   Neutrophils Relative % 50 %   Neutro Abs 2.8  1.7 - 7.7 K/uL   Lymphocytes Relative 43 %   Lymphs Abs 2.4 0.7 - 4.0 K/uL   Monocytes Relative 6 %   Monocytes Absolute 0.4 0.1 - 1.0 K/uL   Eosinophils Relative 1 %   Eosinophils Absolute 0.1 0.0 - 0.5 K/uL   Basophils Relative 0 %   Basophils Absolute 0.0 0.0 - 0.1 K/uL   Immature Granulocytes 0 %   Abs Immature Granulocytes 0.01 0.00 - 0.07  K/uL    Comment: Performed at [facility], [place] [address] [place], [place] [address]    No results found.   A/P: LAJOYA DOMBEK is an 55 y.o. female with hx of HTN here for evaluation of soft tissue mass of her right upper thigh/inferior most part of her buttock-4 x 4 x 4 cm in size.  -The relevant anatomy was reviewed with her. The pathophysiology of soft tissue masses was discussed as well -This has become more bothersome and she is again interested in proceeding with surgery to have this removed today. -We discussed excision of right upper thigh/lower buttock soft tissue mass -The planned procedure, material risks (including, but not limited to, pain, bleeding, infection, scarring, hematoma, seroma, need for additional procedures, recurrence, delayed wound healing, pneumonia, heart attack, stroke, death) benefits and alternatives to surgery were discussed at length. The patient's questions were answered to her satisfaction, she voiced understanding and elected to proceed with surgery. Additionally, we discussed typical postoperative expectations and the recovery process.  Nadeen Landau, MD [facility], [facility]

## 2021-08-28 NOTE — Anesthesia Postprocedure Evaluation (Signed)
Anesthesia Post Note  Patient: Nina Braun  Procedure(s) Performed: EXCISION OF RIGHT BUTTOCK SOFT TISSUE MASS (Right: Buttocks)     Patient location during evaluation: PACU Anesthesia Type: MAC Level of consciousness: awake and alert and oriented Pain management: pain level controlled Vital Signs Assessment: post-procedure vital signs reviewed and stable Respiratory status: spontaneous breathing, nonlabored ventilation and respiratory function stable Cardiovascular status: stable and blood pressure returned to baseline Postop Assessment: no apparent nausea or vomiting Anesthetic complications: no   No notable events documented.  Last Vitals:  Vitals:   08/28/21 1330 08/28/21 1349  BP: (!) 147/78 (!) 141/76  Pulse: (!) 51 (!) 54  Resp: 16 16  Temp:  36.7 C  SpO2: 98% 100%    Last Pain:  Vitals:   08/28/21 1349  TempSrc:   PainSc: 0-No pain                 Jhoana Upham A.

## 2021-08-28 NOTE — Transfer of Care (Signed)
Immediate Anesthesia Transfer of Care Note  Patient: Nina Braun  Procedure(s) Performed: Procedure(s) (LRB): EXCISION OF RIGHT BUTTOCK SOFT TISSUE MASS (Right)  Patient Location: PACU  Anesthesia Type: MAC  Level of Consciousness: awake, sedated, patient cooperative and responds to stimulation  Airway & Oxygen Therapy: Patient Spontanous Breathing and Patient connected on RA and soft FM  Post-op Assessment: Report given to PACU RN, Post -op Vital signs reviewed and stable and Patient moving all extremities  Post vital signs: Reviewed and stable  Complications: No apparent anesthesia complications

## 2021-08-28 NOTE — Anesthesia Procedure Notes (Signed)
Procedure Name: MAC Date/Time: 08/28/2021 11:28 AM Performed by: Justice Rocher, CRNA Pre-anesthesia Checklist: Timeout performed, Patient being monitored, Suction available, Emergency Drugs available and Patient identified Patient Re-evaluated:Patient Re-evaluated prior to induction Oxygen Delivery Method: Simple face mask Preoxygenation: Pre-oxygenation with 100% oxygen Induction Type: IV induction Placement Confirmation: CO2 detector, breath sounds checked- equal and bilateral and positive ETCO2

## 2021-08-29 ENCOUNTER — Encounter (HOSPITAL_BASED_OUTPATIENT_CLINIC_OR_DEPARTMENT_OTHER): Payer: Self-pay | Admitting: Surgery

## 2021-08-30 LAB — SURGICAL PATHOLOGY

## 2022-01-02 NOTE — H&P (Signed)
Nina Braun is an 56 y.o. G3P0 who is admitted for Hysteroscopy D&C with possible Myosure polypectomy for postmenopausal bleeding.  Patient has history of PMB in 2021 for which she had an EMB which was benign. She has since had recurrent of bleeding. Has multiple episodes of spotting since. She declined repeat EMB in the office due to significant discomfort with previous sampling.  Work-up: Pap smear (10/12/2020): NILM/HRHPV negative  EMB (11/08/2020): Scattered small fragments of benign inactive endometrial mucosa. No hyperplasia or malignancy. Incidental fragments of benign squamous and endocervical mucosa.  TVUS (11/09/2020):  Uterus 9.09 x 4.47 x 5.63cm Endometrial thickness 1.27cm Fibroid 1: 2.62cm   Fibroid 2: 2.78cm Right ovary 3.08cm   Left ovary 2.56cm Anteverted uterus. Uterus inhomogenous in appearance with streaky shadowing - questionable adenomyosis vs fibroids. Uterine fibroids 2.6 x 2.0 x 1.9cm, fundal 2.8 x 2.7 x 2.4cm. Endometrium thickened and irregular in appearance - no blood flow noted. Endometrium difficult to clearly evaluate. Bilateral ovaries wnl. No adnexal masses seen.  Patient Active Problem List   Diagnosis Date Noted   Obesity 04/25/2011   HTN (hypertension) 04/25/2011   Thyromegaly 04/25/2011   Vitamin D deficiency 04/25/2011   MEDICAL/FAMILY/SOCIAL HX: Patient's last menstrual period was 11/24/2016 (approximate).    Past Medical History:  Diagnosis Date   Arthritis    hands   Arthritis    right thumb   COVID-19 11/24/2020   no symptoms   Gout 2022   right great toe   History of palpitations 08/25/2021   Patient states that she had heart palpitations a few years ago. Her doctor advised her to cut out caffeine, chocolate, etc. She states no problems since she eliminated caffeine.   History of palpitations in adulthood    11-24-2020  per pt had palpitaions approx. 8 yrs ago, stated had work-up done was told everything ok,  stated had stopped  drinking caffine and no issue since   Hypertension    followed by pcp   Lipoma    right buttock   Thyromegaly    [place] head/ neck in epic 10-26-2014 enlarged thyroid without nodules   Vitamin D deficient osteomalacia     Past Surgical History:  Procedure Laterality Date   COLONOSCOPY WITH PROPOFOL  2019   DILATION AND CURETTAGE OF UTERUS  12-21-2000  @[facility]    w/ suction   MASS EXCISION Right 08/28/2021   Procedure: EXCISION OF RIGHT BUTTOCK SOFT TISSUE MASS;  Surgeon: Ileana Roup, MD;  Location: [facility];  Service: General;  Laterality: Right;    Family History  Problem Relation Age of Onset   Hypertension Mother    Heart disease Mother    Diabetes Mother    Hyperlipidemia Mother    Heart disease Father    Hypertension Father    Stroke Father    Hyperlipidemia Father    Allergies Sister    Hypertension Sister    Diabetes Brother    Cancer Paternal Grandfather    Diabetes Daughter     Social History:  reports that she quit smoking about 12 years ago. Her smoking use included cigarettes. She has never used smokeless tobacco. She reports current alcohol use of about 7.0 standard drinks per week. She reports that she does not currently use drugs after having used the following drugs: Marijuana.  ALLERGIES/MEDS:  Allergies: Not on File  No medications prior to admission.     Review of Systems  Constitutional: Negative.   HENT: Negative.  Eyes: Negative.   Respiratory: Negative.    Cardiovascular: Negative.   Gastrointestinal: Negative.   Genitourinary: Negative.   Musculoskeletal: Negative.   Skin: Negative.   Neurological: Negative.   Endo/Heme/Allergies: Negative.   Psychiatric/Behavioral: Negative.     Last menstrual period 11/24/2016. Gen:  NAD, pleasant and cooperative Cardio:  RRR Pulm:  CTAB, no wheezes/rales/rhonchi Abd:  Soft, non-distended, non-tender throughout, no rebound/guarding Ext:  No bilateral LE edema, no bilateral  calf tenderness Pelvic: Labia - unremarkable, vagina - pink moist mucosa, no lesions or abnormal discharge, cervix - no discharge or lesions or CMT, adnexa - no masses or tenderness - uterus non-tender and normal size on palpation, atrophic changes noted, exam limited to body habitus  No results found for this or any previous visit (from the past 24 hour(s)).  No results found.   ASSESSMENT/PLAN: Nina Braun is a 56 y.o. G3P0 who is admitted for Hysteroscopy D&C with possible Myosure polypectomy for postmenopausal bleeding.  - Admit to [facility] - Admit labs (CBC, T&S) - Diet:  ERAS pathway/per anesthesia - IVF:  Per anesthesia - VTE Prophylaxis:  SCDs - Antibiotics: None - D/C home same-day  Consents: I discussed with the patient that this surgery is performed to look inside the uterus and remove the uterine lining.  Prior to surgery, the risks and benefits of the surgery, as well as alternative treatments, have been discussed.  The risks include, but are not limited to bleeding, including the need for a blood transfusion, infection, damage to organs and tissues, including uterine perforation, requiring additional surgery, postoperative pain, short-term and long-term, failure of the procedure to control symptoms, need for hysterectomy to control bleeding, fluid overload, which could create electrolyte abnormalities and the need to stop the procedure before completion, inability to safely complete the procedure, deep vein thrombosis and/or pulmonary embolism, painful intercourse, complications the course of which cannot be predicted or prevented, and death.  Patient was consented for blood products.  The patient is aware that bleeding may result in the need for a blood transfusion which includes risk of transmission of HIV (1:2 million), Hepatitis C (1:2 million), and Hepatitis B (1:200 thousand) and transfusion reaction.  Patient voiced understanding of the above risks as well as understanding  of indications for blood transfusion.   Drema Dallas, DO [phone] (office)

## 2022-01-09 ENCOUNTER — Encounter (HOSPITAL_BASED_OUTPATIENT_CLINIC_OR_DEPARTMENT_OTHER): Payer: Self-pay | Admitting: Obstetrics and Gynecology

## 2022-01-09 ENCOUNTER — Other Ambulatory Visit: Payer: Self-pay

## 2022-01-15 ENCOUNTER — Encounter (HOSPITAL_BASED_OUTPATIENT_CLINIC_OR_DEPARTMENT_OTHER)
Admission: RE | Admit: 2022-01-15 | Discharge: 2022-01-15 | Disposition: A | Discharge status: Home / Self Care | Payer: 59 | Source: Ambulatory Visit | Attending: Obstetrics and Gynecology | Admitting: Obstetrics and Gynecology

## 2022-01-15 DIAGNOSIS — Z01812 Encounter for preprocedural laboratory examination: Secondary | ICD-10-CM | POA: Insufficient documentation

## 2022-01-15 DIAGNOSIS — I10 Essential (primary) hypertension: Secondary | ICD-10-CM | POA: Diagnosis not present

## 2022-01-15 DIAGNOSIS — N950 Postmenopausal bleeding: Secondary | ICD-10-CM | POA: Insufficient documentation

## 2022-01-15 DIAGNOSIS — Z6838 Body mass index (BMI) 38.0-38.9, adult: Secondary | ICD-10-CM | POA: Diagnosis not present

## 2022-01-15 DIAGNOSIS — E669 Obesity, unspecified: Secondary | ICD-10-CM | POA: Diagnosis not present

## 2022-01-15 DIAGNOSIS — R9389 Abnormal findings on diagnostic imaging of other specified body structures: Secondary | ICD-10-CM | POA: Diagnosis not present

## 2022-01-15 DIAGNOSIS — N840 Polyp of corpus uteri: Secondary | ICD-10-CM | POA: Diagnosis not present

## 2022-01-15 DIAGNOSIS — Z87891 Personal history of nicotine dependence: Secondary | ICD-10-CM | POA: Diagnosis not present

## 2022-01-15 DIAGNOSIS — F1210 Cannabis abuse, uncomplicated: Secondary | ICD-10-CM | POA: Diagnosis not present

## 2022-01-15 LAB — CBC
HCT: 35.1 % — ABNORMAL LOW (ref 36.0–46.0)
Hemoglobin: 11.6 g/dL — ABNORMAL LOW (ref 12.0–15.0)
MCH: 30.2 pg (ref 26.0–34.0)
MCHC: 33 g/dL (ref 30.0–36.0)
MCV: 91.4 fL (ref 80.0–100.0)
Platelets: 237 10*3/uL (ref 150–400)
RBC: 3.84 MIL/uL — ABNORMAL LOW (ref 3.87–5.11)
RDW: 12.6 % (ref 11.5–15.5)
WBC: 7.7 10*3/uL (ref 4.0–10.5)
nRBC: 0 % (ref 0.0–0.2)

## 2022-01-15 LAB — TYPE AND SCREEN
ABO/RH(D): A POS
Antibody Screen: NEGATIVE

## 2022-01-15 NOTE — Progress Notes (Signed)

## 2022-01-16 LAB — BASIC METABOLIC PANEL
Anion gap: 8 (ref 5–15)
BUN: 16 mg/dL (ref 6–20)
CO2: 27 mmol/L (ref 22–32)
Calcium: 9.1 mg/dL (ref 8.9–10.3)
Chloride: 102 mmol/L (ref 98–111)
Creatinine, Ser: 0.93 mg/dL (ref 0.44–1.00)
GFR, Estimated: >60 mL/min (ref >60)
Glucose, Bld: 82 mg/dL (ref 70–99)
Potassium: 4.1 mmol/L (ref 3.5–5.1)
Sodium: 137 mmol/L (ref 135–145)

## 2022-01-17 ENCOUNTER — Other Ambulatory Visit: Payer: Self-pay

## 2022-01-17 ENCOUNTER — Encounter (HOSPITAL_BASED_OUTPATIENT_CLINIC_OR_DEPARTMENT_OTHER)
Admission: RE | Disposition: A | Discharge status: Home / Self Care | Payer: Self-pay | Source: Home / Self Care | Attending: Obstetrics and Gynecology

## 2022-01-17 ENCOUNTER — Encounter (HOSPITAL_BASED_OUTPATIENT_CLINIC_OR_DEPARTMENT_OTHER): Payer: Self-pay | Admitting: Obstetrics and Gynecology

## 2022-01-17 ENCOUNTER — Ambulatory Visit (HOSPITAL_BASED_OUTPATIENT_CLINIC_OR_DEPARTMENT_OTHER): Payer: 59 | Admitting: Anesthesiology

## 2022-01-17 ENCOUNTER — Ambulatory Visit (HOSPITAL_BASED_OUTPATIENT_CLINIC_OR_DEPARTMENT_OTHER)
Admission: RE | Admit: 2022-01-17 | Discharge: 2022-01-17 | Disposition: A | Discharge status: Home / Self Care | Payer: 59 | Attending: Obstetrics and Gynecology | Admitting: Obstetrics and Gynecology

## 2022-01-17 DIAGNOSIS — R9389 Abnormal findings on diagnostic imaging of other specified body structures: Secondary | ICD-10-CM | POA: Diagnosis not present

## 2022-01-17 DIAGNOSIS — Z6838 Body mass index (BMI) 38.0-38.9, adult: Secondary | ICD-10-CM | POA: Insufficient documentation

## 2022-01-17 DIAGNOSIS — N840 Polyp of corpus uteri: Secondary | ICD-10-CM | POA: Insufficient documentation

## 2022-01-17 DIAGNOSIS — N950 Postmenopausal bleeding: Secondary | ICD-10-CM | POA: Insufficient documentation

## 2022-01-17 DIAGNOSIS — F1210 Cannabis abuse, uncomplicated: Secondary | ICD-10-CM | POA: Insufficient documentation

## 2022-01-17 DIAGNOSIS — Z87891 Personal history of nicotine dependence: Secondary | ICD-10-CM | POA: Insufficient documentation

## 2022-01-17 DIAGNOSIS — E669 Obesity, unspecified: Secondary | ICD-10-CM | POA: Insufficient documentation

## 2022-01-17 DIAGNOSIS — I10 Essential (primary) hypertension: Secondary | ICD-10-CM | POA: Insufficient documentation

## 2022-01-17 SURGERY — DILATATION & CURETTAGE/HYSTEROSCOPY WITH MYOSURE
Anesthesia: General | Site: Uterus

## 2022-01-17 MED ORDER — PROPOFOL 10 MG/ML IV BOLUS
INTRAVENOUS | Status: DC | PRN
  Administered 2022-01-17: 200 mg via INTRAVENOUS

## 2022-01-17 MED ORDER — ONDANSETRON HCL 4 MG/2ML IJ SOLN
INTRAMUSCULAR | Status: DC | PRN
Start: 2022-01-17 — End: 2022-01-17
  Administered 2022-01-17: 4 mg via INTRAVENOUS

## 2022-01-17 MED ORDER — KETOROLAC TROMETHAMINE 30 MG/ML IJ SOLN
30.0000 mg | Freq: Once | INTRAMUSCULAR | Status: AC
  Administered 2022-01-17: 30 mg via INTRAVENOUS

## 2022-01-17 MED ORDER — ONDANSETRON HCL 4 MG/2ML IJ SOLN
INTRAMUSCULAR | Status: AC
  Filled 2022-01-17: qty 2

## 2022-01-17 MED ORDER — OXYCODONE HCL 5 MG/5ML PO SOLN: 5.0000 mg | Freq: Once | ORAL | Status: DC | PRN

## 2022-01-17 MED ORDER — OXYCODONE HCL 5 MG PO TABS: 5.0000 mg | ORAL_TABLET | Freq: Once | ORAL | Status: DC | PRN

## 2022-01-17 MED ORDER — ACETAMINOPHEN 10 MG/ML IV SOLN: 1000.0000 mg | Freq: Once | INTRAVENOUS | Status: DC | PRN

## 2022-01-17 MED ORDER — MIDAZOLAM HCL 5 MG/5ML IJ SOLN
INTRAMUSCULAR | Status: DC | PRN
  Administered 2022-01-17: 2 mg via INTRAVENOUS

## 2022-01-17 MED ORDER — MIDAZOLAM HCL 2 MG/2ML IJ SOLN
INTRAMUSCULAR | Status: AC
  Filled 2022-01-17: qty 2

## 2022-01-17 MED ORDER — SODIUM CHLORIDE 0.9 % IR SOLN
Status: DC | PRN
  Administered 2022-01-17: 3000 mL

## 2022-01-17 MED ORDER — LACTATED RINGERS IV SOLN: INTRAVENOUS | Status: DC

## 2022-01-17 MED ORDER — SILVER NITRATE-POT NITRATE 75-25 % EX MISC
CUTANEOUS | Status: DC | PRN
  Administered 2022-01-17: 2
  Administered 2022-01-17: 1

## 2022-01-17 MED ORDER — DEXAMETHASONE SODIUM PHOSPHATE 10 MG/ML IJ SOLN
INTRAMUSCULAR | Status: AC
  Filled 2022-01-17: qty 1

## 2022-01-17 MED ORDER — LIDOCAINE HCL (CARDIAC) PF 100 MG/5ML IV SOSY
PREFILLED_SYRINGE | INTRAVENOUS | Status: DC | PRN
  Administered 2022-01-17: 60 mg via INTRAVENOUS

## 2022-01-17 MED ORDER — DEXAMETHASONE SODIUM PHOSPHATE 10 MG/ML IJ SOLN
INTRAMUSCULAR | Status: DC | PRN
  Administered 2022-01-17: 10 mg via INTRAVENOUS

## 2022-01-17 MED ORDER — AMISULPRIDE (ANTIEMETIC) 5 MG/2ML IV SOLN: 10.0000 mg | Freq: Once | INTRAVENOUS | Status: DC | PRN

## 2022-01-17 MED ORDER — FENTANYL CITRATE (PF) 100 MCG/2ML IJ SOLN
INTRAMUSCULAR | Status: DC | PRN
  Administered 2022-01-17 (×2): 25 ug via INTRAVENOUS

## 2022-01-17 MED ORDER — LIDOCAINE 2% (20 MG/ML) 5 ML SYRINGE
INTRAMUSCULAR | Status: AC
  Filled 2022-01-17: qty 5

## 2022-01-17 MED ORDER — FENTANYL CITRATE (PF) 100 MCG/2ML IJ SOLN: 25.0000 ug | INTRAMUSCULAR | Status: DC | PRN

## 2022-01-17 MED ORDER — FENTANYL CITRATE (PF) 100 MCG/2ML IJ SOLN
INTRAMUSCULAR | Status: AC
  Filled 2022-01-17: qty 2

## 2022-01-17 MED ORDER — IBUPROFEN 800 MG PO TABS: 800.0000 mg | ORAL_TABLET | Freq: Three times a day (TID) | ORAL | 0 refills | Status: AC | PRN

## 2022-01-17 SURGICAL SUPPLY — 19 items
CANISTER SUCT 1200ML W/VALVE (MISCELLANEOUS) ×2 IMPLANT
CATH ROBINSON RED A/P 16FR (CATHETERS) IMPLANT
DEVICE MYOSURE LITE (MISCELLANEOUS) IMPLANT
DEVICE MYOSURE REACH (MISCELLANEOUS) ×1 IMPLANT
DILATOR CANAL MILEX (MISCELLANEOUS) IMPLANT
GAUZE 4X4 16PLY ~~LOC~~+RFID DBL (SPONGE) ×3 IMPLANT
GLOVE SURG ENC MOIS LTX SZ6.5 (GLOVE) ×3 IMPLANT
GLOVE SURG ENC TEXT LTX SZ6.5 (GLOVE) ×3 IMPLANT
GLOVE SURG UNDER POLY LF SZ7 (GLOVE) ×3 IMPLANT
GOWN STRL REUS W/ TWL LRG LVL3 (GOWN DISPOSABLE) ×4 IMPLANT
GOWN STRL REUS W/TWL LRG LVL3 (GOWN DISPOSABLE) ×6
KIT PROCEDURE FLUENT (KITS) ×3 IMPLANT
KIT TURNOVER KIT B (KITS) ×3 IMPLANT
PACK VAGINAL MINOR WOMEN LF (CUSTOM PROCEDURE TRAY) ×3 IMPLANT
PAD OB MATERNITY 4.3X12.25 (PERSONAL CARE ITEMS) ×3 IMPLANT
SEAL ROD LENS SCOPE MYOSURE (ABLATOR) ×3 IMPLANT
SLEEVE SCD COMPRESS KNEE MED (STOCKING) ×3 IMPLANT
TOWEL GREEN STERILE FF (TOWEL DISPOSABLE) ×6 IMPLANT
UNDERPAD 30X36 HEAVY ABSORB (UNDERPADS AND DIAPERS) ×3 IMPLANT

## 2022-01-17 NOTE — Op Note (Signed)
Pre Op Dx:   ?1. Recurrent postmenopausal bleeding ?2. Thickened endometrium on ultrasound ? ?Post Op Dx:   ?1. Recurrent postmenopausal bleeding ?2. Thickened endometrium on ultrasound ?3. Endometrial polyps ?4. Proliferative endometrium ? ?Procedure:   ?Hysteroscopy with Dilation and Curettage with Myosure Polypectomy ?  ?Surgeon:  Dr. Drema Dallas ?Assistants:  None ?Anesthesia:  General ?  ?EBL:  5cc  ?IVF:  See anesthesia documentation ?UOP:  Voided prior to arrival to OR ?Fluid Deficit:  200cc ?  ?Drains:  None ?Specimen removed:  Endometrial curettings and endometrial polyps - sent to pathology ?Device(s) implanted: None ?Case Type:  Clean-contaminated ?Findings:  Proliferative-appearing endometrium and endometrial polyps. Unable to visualize bilateral tubal ostia. ?Complications: None ? ?Indications:  56 y.o. postmenopausal G3P0 with recurrent postmenopausal bleeding and thickened endometrium of 1.27cm on ultrasound. Previous EMB 11/08/20 revealed benign inactive endometrial mucosa without evidence of hyperplasia or malignancy. ? ?Description of each procedure:  After informed consent was obtained the patient was taken to the operating room in the dorsal supine position.  After administration of general anesthesia, the patient was placed in the dorsal lithotomy position and prepped and draped in the usual sterile fashion.  A pre-operative time-out was completed.  The anterior lip of the cervix was grasped with a single-tooth tenaculum and the cervix was serially dilated to accommodate the hysteroscope.  The hysteroscope was advanced and the findings as above was noted. The Myosure Reach was used to resect the polyps and resect/sample the proliferative-appearing endometrium. A sharp banjo curette was used to curettage the endometrium. The single-tooth tenaculum was removed and its sites were made hemostatic with silver nitrate.  Adequate hemostasis was noted.  The patient was awakened and extubated and  appeared to have tolerated the procedure well.  All counts were correct. ? ?Disposition:  PACU ? ?Drema Dallas, DO ? ?

## 2022-01-17 NOTE — Transfer of Care (Signed)
Immediate Anesthesia Transfer of Care Note ? ?Patient: Nina Braun ? ?Procedure(s) Performed: DILATATION & CURETTAGE/HYSTEROSCOPY WITH MYOSURE (Uterus) ? ?Patient Location: PACU ? ?Anesthesia Type:General ? ?Level of Consciousness: sedated ? ?Airway & Oxygen Therapy: Patient Spontanous Breathing and Patient connected to face mask oxygen ? ?Post-op Assessment: Report given to RN and Post -op Vital signs reviewed and stable ? ?Post vital signs: Reviewed and stable ? ?Last Vitals:  ?Vitals Value Taken Time  ?BP 146/85 01/17/22 1418  ?Temp    ?Pulse 60 01/17/22 1420  ?Resp 17 01/17/22 1420  ?SpO2 100 % 01/17/22 1420  ?Vitals shown include unvalidated device data. ? ?Last Pain:  ?Vitals:  ? 01/17/22 1249  ?TempSrc: Oral  ?PainSc: 0-No pain  ?   ? ?Patients Stated Pain Goal: 3 (01/17/22 1249) ? ?Complications: No notable events documented. ?

## 2022-01-17 NOTE — Anesthesia Postprocedure Evaluation (Signed)
Anesthesia Post Note ? ?Patient: Nina Braun ? ?Procedure(s) Performed: DILATATION & CURETTAGE/HYSTEROSCOPY WITH MYOSURE (Uterus) ? ?  ? ?Patient location during evaluation: PACU ?Anesthesia Type: General ?Level of consciousness: awake ?Pain management: pain level controlled ?Vital Signs Assessment: post-procedure vital signs reviewed and stable ?Respiratory status: spontaneous breathing, nonlabored ventilation, respiratory function stable and patient connected to nasal cannula oxygen ?Cardiovascular status: blood pressure returned to baseline and stable ?Postop Assessment: no apparent nausea or vomiting ?Anesthetic complications: no ? ? ?No notable events documented. ? ?Last Vitals:  ?Vitals:  ? 01/17/22 1437 01/17/22 1457  ?BP: (!) 152/82 (!) 125/91  ?Pulse: (!) 54 (!) 53  ?Resp: 15 15  ?Temp:  36.5 ?C  ?SpO2: 99% 96%  ?  ?Last Pain:  ?Vitals:  ? 01/17/22 1457  ?TempSrc:   ?PainSc: 4   ? ? ?  ?  ?  ?  ?  ?  ? ?Shoshanah Dapper P Regene Mccarthy ? ? ? ? ?

## 2022-01-17 NOTE — Anesthesia Preprocedure Evaluation (Addendum)
Anesthesia Evaluation  ?Patient identified by MRN, date of birth, ID band ?Patient awake ? ? ? ?Reviewed: ?Allergy & Precautions, NPO status , Patient's Chart, lab work & pertinent test results ? ?Airway ?Mallampati: III ? ?TM Distance: >3 FB ?Neck ROM: Full ? ? ? Dental ?no notable dental hx. ? ?  ?Pulmonary ?former smoker,  ?  ?Pulmonary exam normal ?breath sounds clear to auscultation ? ? ? ? ? ? Cardiovascular ?hypertension, Pt. on medications ?Normal cardiovascular exam ?Rhythm:Regular Rate:Normal ? ?ECG: NSR, rate 65 ?  ?Neuro/Psych ?negative neurological ROS ? negative psych ROS  ? GI/Hepatic ?negative GI ROS, (+)  ?  ? substance abuse ? ,   ?Endo/Other  ?negative endocrine ROS ? Renal/GU ?negative Renal ROS  ? ?  ?Musculoskeletal ? ?(+) Arthritis , Gout  ? Abdominal ?(+) + obese,   ?Peds ? Hematology ? ?(+) Blood dyscrasia, anemia ,   ?Anesthesia Other Findings ?POST MENOPAUSAL BLEEDING ? Reproductive/Obstetrics ? ?  ? ? ? ? ? ? ? ? ? ? ? ? ? ?  ?  ? ? ? ? ? ? ? ?Anesthesia Physical ?Anesthesia Plan ? ?ASA: 2 ? ?Anesthesia Plan: General  ? ?Post-op Pain Management:   ? ?Induction: Intravenous ? ?PONV Risk Score and Plan: 3 and Ondansetron, Midazolam and Treatment may vary due to age or medical condition ? ?Airway Management Planned: LMA ? ?Additional Equipment:  ? ?Intra-op Plan:  ? ?Post-operative Plan: Extubation in OR ? ?Informed Consent: I have reviewed the patients History and Physical, chart, labs and discussed the procedure including the risks, benefits and alternatives for the proposed anesthesia with the patient or authorized representative who has indicated his/her understanding and acceptance.  ? ? ? ?Dental advisory given ? ?Plan Discussed with: CRNA ? ?Anesthesia Plan Comments:   ? ? ? ? ? ? ?Anesthesia Quick Evaluation ? ?

## 2022-01-17 NOTE — Discharge Instructions (Signed)
No ibuprofen/motrin until after 10:15pm tonight, if needed. ? ? ?Post Anesthesia Home Care Instructions ? ?Activity: ?Get plenty of rest for the remainder of the day. A responsible individual must stay with you for 24 hours following the procedure.  ?For the next 24 hours, DO NOT: ?-Drive a car ?-Paediatric nurse ?-Drink alcoholic beverages ?-Take any medication unless instructed by your physician ?-Make any legal decisions or sign important papers. ? ?Meals: ?Start with liquid foods such as gelatin or soup. Progress to regular foods as tolerated. Avoid greasy, spicy, heavy foods. If nausea and/or vomiting occur, drink only clear liquids until the nausea and/or vomiting subsides. Call your physician if vomiting continues. ? ?Special Instructions/Symptoms: ?Your throat may feel dry or sore from the anesthesia or the breathing tube placed in your throat during surgery. If this causes discomfort, gargle with warm salt water. The discomfort should disappear within 24 hours. ? ?If you had a scopolamine patch placed behind your ear for the management of post- operative nausea and/or vomiting: ? ?1. The medication in the patch is effective for 72 hours, after which it should be removed.  Wrap patch in a tissue and discard in the trash. Wash hands thoroughly with soap and water. ?2. You may remove the patch earlier than 72 hours if you experience unpleasant side effects which may include dry mouth, dizziness or visual disturbances. ?3. Avoid touching the patch. Wash your hands with soap and water after contact with the patch. ?    ?

## 2022-01-17 NOTE — Anesthesia Procedure Notes (Signed)
Procedure Name: LMA Insertion ?Date/Time: 01/17/2022 1:41 PM ?Performed by: Lavonia Dana, CRNA ?Pre-anesthesia Checklist: Patient identified, Emergency Drugs available, Suction available and Patient being monitored ?Patient Re-evaluated:Patient Re-evaluated prior to induction ?Oxygen Delivery Method: Circle system utilized ?Preoxygenation: Pre-oxygenation with 100% oxygen ?Induction Type: IV induction ?Ventilation: Mask ventilation without difficulty ?LMA: LMA inserted ?LMA Size: 4.0 ?Number of attempts: 1 ?Airway Equipment and Method: Bite block ?Placement Confirmation: positive ETCO2 ?Tube secured with: Tape ?Dental Injury: Teeth and Oropharynx as per pre-operative assessment  ? ? ? ? ?

## 2022-01-17 NOTE — Interval H&P Note (Signed)
History and Physical Interval Note: ? ?01/17/2022 ?12:57 PM ? ?Nina Braun  has presented today for surgery, with the diagnosis of POST MENOPAUSAL BLEEDING.  The various methods of treatment have been discussed with the patient and family. After consideration of risks, benefits and other options for treatment, the patient has consented to  Procedure(s) with comments: ?DILATATION & CURETTAGE/HYSTEROSCOPY WITH MYOSURE (N/A) - rep will be here confirmed on 03/6 CS as a surgical intervention.  The patient's history has been reviewed, patient examined, no change in status, stable for surgery.  I have reviewed the patient's chart and labs.  Questions were answered to the patient's satisfaction.   ? ? ?Drema Dallas ? ? ?

## 2022-01-18 ENCOUNTER — Encounter (HOSPITAL_BASED_OUTPATIENT_CLINIC_OR_DEPARTMENT_OTHER): Payer: Self-pay | Admitting: Obstetrics and Gynecology

## 2022-01-18 LAB — SURGICAL PATHOLOGY

## 2022-07-31 NOTE — Progress Notes (Signed)
Subjective:   Patient ID: Nina Braun, female   DOB: 56 y.o.   MRN: [id number]   HPI Patient presents with pain in the right over left foot and also some thickness of nails and some inflammation in the tendon.  States that its been getting sore for period of time especially right   Review of Systems  All other systems reviewed and are negative.       Objective:  Physical Exam Vitals and nursing note reviewed.  Constitutional:      Appearance: She is well-developed.  Pulmonary:     Effort: Pulmonary effort is normal.  Musculoskeletal:        General: Normal range of motion.  Skin:    General: Skin is warm.  Neurological:     Mental Status: She is alert.     Neurovascular status intact muscle strength adequate range of motion adequate discomfort in the right foot with inflammation noted moderate in intensity mild on the left     Assessment:  Tendinitis-like symptomatology right over left with inflammation     Plan:  H&P x-ray reviewed and went ahead today and advised on ice therapy supportive therapy and shoe gear modifications.  Reappoint if symptoms recur may require other treatment and do not recommend treatment for the nail disease at this time as I think it is trauma over fungus  X-rays do not indicate any kind of bony pathology or indication of fracture associated with the pain

## 2022-08-10 ENCOUNTER — Ambulatory Visit
Admission: RE | Admit: 2022-08-10 | Discharge: 2022-08-10 | Disposition: A | Discharge status: Home / Self Care | Payer: 59 | Source: Ambulatory Visit | Attending: Internal Medicine | Admitting: Internal Medicine

## 2022-08-10 ENCOUNTER — Other Ambulatory Visit: Payer: Self-pay | Admitting: Internal Medicine

## 2022-08-10 DIAGNOSIS — M25551 Pain in right hip: Secondary | ICD-10-CM

## 2022-08-10 DIAGNOSIS — M62830 Muscle spasm of back: Secondary | ICD-10-CM
